# Patient Record
Sex: Female | Born: 1995 | Race: White | Hispanic: No | Marital: Single
Health system: Midwestern US, Academic
[De-identification: ages and names within clinical notes are randomized; demographics above are authoritative.]

---

## 2017-12-25 ENCOUNTER — Encounter: Admit: 2017-12-25 | Discharge: 2017-12-25

## 2017-12-28 ENCOUNTER — Encounter: Admit: 2017-12-28 | Discharge: 2017-12-28

## 2017-12-28 DIAGNOSIS — F4310 Post-traumatic stress disorder, unspecified: ICD-10-CM

## 2017-12-28 DIAGNOSIS — E119 Type 2 diabetes mellitus without complications: Principal | ICD-10-CM

## 2017-12-28 LAB — POC GLUCOSE
Lab: 53 mg/dL — ABNORMAL LOW (ref 70–100)
Lab: 88 mg/dL (ref 70–100)
Lab: 75 mg/dL (ref 70–100)
Lab: 53 mg/dL — ABNORMAL LOW (ref 70–100)
Lab: 53 mg/dL — ABNORMAL LOW (ref 70–100)
Lab: 70 mg/dL (ref 70–100)
Lab: 91 mg/dL (ref 70–100)

## 2017-12-28 LAB — URINALYSIS DIPSTICK
Lab: NEGATIVE
Lab: NEGATIVE MMOL/L (ref 21–30)
Lab: NEGATIVE U/L (ref 7–40)
Lab: NEGATIVE U/L (ref 7–56)
Lab: NEGATIVE mL/min
Lab: NEGATIVE
Lab: NEGATIVE (ref 3–12)

## 2017-12-28 LAB — THYROID STIMULATING HORMONE-TSH: Lab: 1.6 uU/mL (ref 0.35–5.00)

## 2017-12-28 LAB — HEPATITIS B SURFACE AG: Lab: NEGATIVE MMOL/L — ABNORMAL LOW (ref 98–110)

## 2017-12-28 LAB — COMPREHENSIVE METABOLIC PANEL: Lab: 139 MMOL/L (ref 137–147)

## 2017-12-28 LAB — HEPATITIS C ANTIBODY W REFLEX HCV PCR QUANT: Lab: NEGATIVE

## 2017-12-28 LAB — BETA-HCG: Lab: 410 U/L — ABNORMAL HIGH (ref <5)

## 2017-12-28 LAB — RUBELLA AB IGG

## 2017-12-28 LAB — FREE T4 (FREE THYROXINE) ONLY: Lab: 0.9 ng/dL (ref 0.6–1.6)

## 2017-12-28 LAB — CBC: Lab: 7 10*3/uL (ref 4.5–11.0)

## 2017-12-28 LAB — URINALYSIS, MICROSCOPIC

## 2017-12-28 LAB — HIV, STAT: Lab: NEGATIVE mg/dL (ref 70–100)

## 2017-12-28 LAB — HEMOGLOBIN A1C: Lab: 7 % — ABNORMAL HIGH (ref 4.0–6.0)

## 2017-12-28 MED ORDER — INSULIN REGULAR HUMAN(#) 1 UNIT/ML IJ SYRINGE
1-20 [IU] | INTRAVENOUS | 0 refills | Status: DC
Start: 2017-12-28 — End: 2018-01-01

## 2017-12-28 MED ORDER — INSULIN REGULAR HUMAN(#) 1 UNIT/ML IJ SYRINGE
2 [IU] | Freq: Once | INTRAVENOUS | 0 refills | Status: CP
Start: 2017-12-28 — End: ?
  Administered 2017-12-29: 04:00:00 2 [IU] via INTRAVENOUS

## 2017-12-28 MED ORDER — INSULIN PUMP - ASPART
Freq: Two times a day (BID) | SUBCUTANEOUS | 0 refills | Status: DC
Start: 2017-12-28 — End: 2018-01-01

## 2017-12-29 ENCOUNTER — Encounter: Admit: 2017-12-29 | Discharge: 2017-12-29

## 2017-12-29 LAB — URINE COLLECTION
Lab: 174 mL
Lab: 24

## 2017-12-29 LAB — POC GLUCOSE
Lab: 191 mg/dL — ABNORMAL HIGH (ref 70–100)
Lab: 209 mg/dL — ABNORMAL HIGH (ref 70–100)
Lab: 178 mg/dL — ABNORMAL HIGH (ref 70–100)
Lab: 164 mg/dL — ABNORMAL HIGH (ref 70–100)
Lab: 162 mg/dL — ABNORMAL HIGH (ref 70–100)
Lab: 140 mg/dL — ABNORMAL HIGH (ref 70–100)
Lab: 149 mg/dL — ABNORMAL HIGH (ref 70–100)
Lab: 141 mg/dL — ABNORMAL HIGH (ref 70–100)
Lab: 140 mg/dL — ABNORMAL HIGH (ref 70–100)
Lab: 125 mg/dL — ABNORMAL HIGH (ref 70–100)
Lab: 103 mg/dL — ABNORMAL HIGH (ref 70–100)
Lab: 71 mg/dL (ref 70–100)
Lab: 82 mg/dL — ABNORMAL HIGH (ref 70–100)
Lab: 112 mg/dL — ABNORMAL HIGH (ref 70–100)

## 2017-12-29 LAB — TOTAL PROTEIN-URINE 24 HR: Lab: <4 mg/dL

## 2017-12-29 LAB — CREATININE CLEARANCE-URINE 24H: Lab: 58 mg/dL

## 2017-12-29 LAB — CULTURE-URINE W/SENSITIVITY

## 2017-12-29 LAB — SYPHILIS AB SCREEN: Lab: NEGATIVE mg/dL (ref 0.4–1.00)

## 2017-12-30 LAB — POC GLUCOSE
Lab: 177 mg/dL — ABNORMAL HIGH (ref 70–100)
Lab: 180 mg/dL — ABNORMAL HIGH (ref 70–100)
Lab: 196 mg/dL — ABNORMAL HIGH (ref 70–100)
Lab: 133 mg/dL — ABNORMAL HIGH (ref 70–100)
Lab: 64 mg/dL — ABNORMAL LOW (ref 70–100)
Lab: 88 mg/dL (ref >60)
Lab: 71 mg/dL (ref 70–100)
Lab: 123 mg/dL — ABNORMAL HIGH (ref 70–100)
Lab: 83 mg/dL (ref 70–100)
Lab: 47 mg/dL — CL (ref 70–100)
Lab: 170 mg/dL — ABNORMAL HIGH (ref >60)
Lab: 123 mg/dL — ABNORMAL HIGH (ref 70–100)

## 2017-12-31 ENCOUNTER — Ambulatory Visit: Admit: 2017-12-28 | Discharge: 2017-12-28

## 2017-12-31 ENCOUNTER — Inpatient Hospital Stay
Admit: 2017-12-28 | Discharge: 2017-12-31 | Disposition: A | Discharge status: Home / Self Care | Payer: BC Managed Care – PPO | Attending: Maternal & Fetal Medicine | Admitting: Maternal & Fetal Medicine

## 2017-12-31 ENCOUNTER — Encounter: Admit: 2017-12-31 | Discharge: 2017-12-31

## 2017-12-31 DIAGNOSIS — F4310 Post-traumatic stress disorder, unspecified: ICD-10-CM

## 2017-12-31 DIAGNOSIS — Z62810 Personal history of physical and sexual abuse in childhood: ICD-10-CM

## 2017-12-31 DIAGNOSIS — Z9641 Presence of insulin pump (external) (internal): ICD-10-CM

## 2017-12-31 DIAGNOSIS — Z794 Long term (current) use of insulin: ICD-10-CM

## 2017-12-31 DIAGNOSIS — Z3A01 Less than 8 weeks gestation of pregnancy: Secondary | ICD-10-CM

## 2017-12-31 DIAGNOSIS — O24011 Pre-existing diabetes mellitus, type 1, in pregnancy, first trimester: Principal | ICD-10-CM

## 2017-12-31 DIAGNOSIS — O99341 Other mental disorders complicating pregnancy, first trimester: ICD-10-CM

## 2017-12-31 DIAGNOSIS — E1065 Type 1 diabetes mellitus with hyperglycemia: ICD-10-CM

## 2017-12-31 LAB — POC GLUCOSE
Lab: 65 mg/dL — ABNORMAL LOW (ref 70–100)
Lab: 73 mg/dL (ref 70–100)
Lab: 76 mg/dL (ref 70–100)
Lab: 70 mg/dL (ref 70–100)
Lab: 95 mg/dL (ref 70–100)
Lab: 99 mg/dL (ref 70–100)
Lab: 158 mg/dL — ABNORMAL HIGH (ref 70–100)
Lab: 87 mg/dL (ref 70–100)
Lab: 91 mg/dL (ref 70–100)

## 2017-12-31 LAB — DIRECT EXAM (WET PREP)

## 2018-01-01 LAB — CHLAM/NG PCR URINE: Lab: NEGATIVE FL (ref 80–100)

## 2018-01-05 ENCOUNTER — Encounter: Admit: 2018-01-05 | Discharge: 2018-01-05

## 2018-01-05 DIAGNOSIS — F4310 Post-traumatic stress disorder, unspecified: ICD-10-CM

## 2018-01-05 DIAGNOSIS — E119 Type 2 diabetes mellitus without complications: Principal | ICD-10-CM

## 2018-01-06 ENCOUNTER — Encounter: Admit: 2018-01-06 | Discharge: 2018-01-06

## 2018-01-06 ENCOUNTER — Ambulatory Visit: Admit: 2018-01-06 | Discharge: 2018-01-07 | Payer: BC Managed Care – PPO

## 2018-01-06 DIAGNOSIS — E119 Type 2 diabetes mellitus without complications: Principal | ICD-10-CM

## 2018-01-06 DIAGNOSIS — F4310 Post-traumatic stress disorder, unspecified: ICD-10-CM

## 2018-01-07 ENCOUNTER — Encounter: Admit: 2018-01-07 | Discharge: 2018-01-07

## 2018-01-07 DIAGNOSIS — Z3A01 Less than 8 weeks gestation of pregnancy: Principal | ICD-10-CM

## 2018-01-07 DIAGNOSIS — E119 Type 2 diabetes mellitus without complications: Principal | ICD-10-CM

## 2018-01-07 DIAGNOSIS — F4310 Post-traumatic stress disorder, unspecified: ICD-10-CM

## 2018-01-21 ENCOUNTER — Encounter: Admit: 2018-01-21 | Discharge: 2018-01-21

## 2018-01-21 ENCOUNTER — Ambulatory Visit: Admit: 2018-01-21 | Discharge: 2018-01-22 | Payer: BC Managed Care – PPO

## 2018-01-21 DIAGNOSIS — E119 Type 2 diabetes mellitus without complications: Principal | ICD-10-CM

## 2018-01-21 DIAGNOSIS — F4310 Post-traumatic stress disorder, unspecified: ICD-10-CM

## 2018-01-22 ENCOUNTER — Encounter: Admit: 2018-01-22 | Discharge: 2018-01-22

## 2018-01-22 DIAGNOSIS — Z3A01 Less than 8 weeks gestation of pregnancy: Principal | ICD-10-CM

## 2018-01-22 DIAGNOSIS — E119 Type 2 diabetes mellitus without complications: Principal | ICD-10-CM

## 2018-01-22 DIAGNOSIS — F4310 Post-traumatic stress disorder, unspecified: ICD-10-CM

## 2018-01-22 MED ORDER — ONDANSETRON 4 MG PO TBDI
4 mg | ORAL_TABLET | ORAL | 0 refills | Status: SS | PRN
Start: 2018-01-22 — End: 2018-04-07

## 2018-02-12 ENCOUNTER — Encounter: Admit: 2018-02-12 | Discharge: 2018-02-12

## 2018-02-12 DIAGNOSIS — O24911 Unspecified diabetes mellitus in pregnancy, first trimester: Principal | ICD-10-CM

## 2018-02-15 ENCOUNTER — Encounter: Admit: 2018-02-15 | Discharge: 2018-02-15

## 2018-02-15 ENCOUNTER — Ambulatory Visit: Admit: 2018-02-15 | Discharge: 2018-02-15

## 2018-02-15 DIAGNOSIS — F4310 Post-traumatic stress disorder, unspecified: ICD-10-CM

## 2018-02-15 DIAGNOSIS — E119 Type 2 diabetes mellitus without complications: Principal | ICD-10-CM

## 2018-02-15 DIAGNOSIS — O0991 Supervision of high risk pregnancy, unspecified, first trimester: Secondary | ICD-10-CM

## 2018-02-16 ENCOUNTER — Ambulatory Visit: Admit: 2018-02-15 | Discharge: 2018-02-16 | Payer: BC Managed Care – PPO

## 2018-02-16 DIAGNOSIS — O24911 Unspecified diabetes mellitus in pregnancy, first trimester: Principal | ICD-10-CM

## 2018-02-16 DIAGNOSIS — Z3682 Encounter for antenatal screening for nuchal translucency: ICD-10-CM

## 2018-02-16 DIAGNOSIS — O0991 Supervision of high risk pregnancy, unspecified, first trimester: ICD-10-CM

## 2018-03-01 ENCOUNTER — Ambulatory Visit: Admit: 2018-03-01 | Discharge: 2018-03-02 | Payer: BC Managed Care – PPO

## 2018-03-01 ENCOUNTER — Encounter: Admit: 2018-03-01 | Discharge: 2018-03-01

## 2018-03-01 DIAGNOSIS — F4310 Post-traumatic stress disorder, unspecified: ICD-10-CM

## 2018-03-01 DIAGNOSIS — E119 Type 2 diabetes mellitus without complications: Principal | ICD-10-CM

## 2018-03-02 DIAGNOSIS — O0992 Supervision of high risk pregnancy, unspecified, second trimester: Principal | ICD-10-CM

## 2018-03-02 DIAGNOSIS — Z3A14 14 weeks gestation of pregnancy: ICD-10-CM

## 2018-03-08 ENCOUNTER — Encounter: Admit: 2018-03-08 | Discharge: 2018-03-08

## 2018-03-08 DIAGNOSIS — E119 Type 2 diabetes mellitus without complications: Principal | ICD-10-CM

## 2018-03-08 DIAGNOSIS — F4310 Post-traumatic stress disorder, unspecified: ICD-10-CM

## 2018-03-08 DIAGNOSIS — Z3A01 Less than 8 weeks gestation of pregnancy: Secondary | ICD-10-CM

## 2018-03-08 DIAGNOSIS — O0990 Supervision of high risk pregnancy, unspecified, unspecified trimester: ICD-10-CM

## 2018-03-09 ENCOUNTER — Ambulatory Visit: Admit: 2018-03-08 | Discharge: 2018-03-09 | Payer: BC Managed Care – PPO

## 2018-03-09 DIAGNOSIS — O24912 Unspecified diabetes mellitus in pregnancy, second trimester: ICD-10-CM

## 2018-03-09 DIAGNOSIS — Z794 Long term (current) use of insulin: ICD-10-CM

## 2018-03-09 DIAGNOSIS — Z3A15 15 weeks gestation of pregnancy: ICD-10-CM

## 2018-03-09 DIAGNOSIS — O24012 Pre-existing diabetes mellitus, type 1, in pregnancy, second trimester: Principal | ICD-10-CM

## 2018-03-09 DIAGNOSIS — E109 Type 1 diabetes mellitus without complications: ICD-10-CM

## 2018-03-24 ENCOUNTER — Encounter: Admit: 2018-03-24 | Discharge: 2018-03-24

## 2018-03-26 ENCOUNTER — Encounter: Admit: 2018-03-26 | Discharge: 2018-03-26

## 2018-03-26 DIAGNOSIS — O24912 Unspecified diabetes mellitus in pregnancy, second trimester: Principal | ICD-10-CM

## 2018-03-26 DIAGNOSIS — Z363 Encounter for antenatal screening for malformations: ICD-10-CM

## 2018-03-26 DIAGNOSIS — Z3686 Encounter for antenatal screening for cervical length: ICD-10-CM

## 2018-03-29 ENCOUNTER — Ambulatory Visit: Admit: 2018-03-29 | Discharge: 2018-03-29

## 2018-03-29 ENCOUNTER — Encounter: Admit: 2018-03-29 | Discharge: 2018-03-29

## 2018-03-29 DIAGNOSIS — F4310 Post-traumatic stress disorder, unspecified: ICD-10-CM

## 2018-03-29 DIAGNOSIS — E109 Type 1 diabetes mellitus without complications: ICD-10-CM

## 2018-03-29 DIAGNOSIS — E119 Type 2 diabetes mellitus without complications: Principal | ICD-10-CM

## 2018-03-30 ENCOUNTER — Encounter: Admit: 2018-03-30 | Discharge: 2018-03-30

## 2018-03-30 ENCOUNTER — Ambulatory Visit: Admit: 2018-03-29 | Discharge: 2018-03-30 | Payer: Commercial Managed Care - HMO

## 2018-03-30 DIAGNOSIS — O0992 Supervision of high risk pregnancy, unspecified, second trimester: Principal | ICD-10-CM

## 2018-03-31 ENCOUNTER — Encounter: Admit: 2018-03-31 | Discharge: 2018-03-31

## 2018-04-06 ENCOUNTER — Encounter: Admit: 2018-04-06 | Discharge: 2018-04-06

## 2018-04-06 ENCOUNTER — Observation Stay: Admit: 2018-04-07 | Discharge: 2018-04-08 | Payer: Commercial Managed Care - HMO

## 2018-04-06 DIAGNOSIS — O24919 Unspecified diabetes mellitus in pregnancy, unspecified trimester: ICD-10-CM

## 2018-04-06 DIAGNOSIS — K929 Disease of digestive system, unspecified: ICD-10-CM

## 2018-04-06 DIAGNOSIS — E119 Type 2 diabetes mellitus without complications: Principal | ICD-10-CM

## 2018-04-06 DIAGNOSIS — F4310 Post-traumatic stress disorder, unspecified: ICD-10-CM

## 2018-04-06 DIAGNOSIS — K589 Irritable bowel syndrome without diarrhea: ICD-10-CM

## 2018-04-06 MED ORDER — PRENATAL VIT-IRON FUM-FOLIC AC 28 MG IRON- 800 MCG PO TAB
1 | Freq: Every day | ORAL | 0 refills | Status: DC
Start: 2018-04-06 — End: 2018-04-08
  Administered 2018-04-07 – 2018-04-08 (×2): 1 via ORAL

## 2018-04-06 MED ORDER — ONDANSETRON 4 MG PO TBDI
4 mg | ORAL | 0 refills | Status: DC | PRN
Start: 2018-04-06 — End: 2018-04-08

## 2018-04-06 MED ORDER — INSULIN PUMP - LISPRO
Freq: Three times a day (TID) | SUBCUTANEOUS | 0 refills | Status: DC | PRN
Start: 2018-04-06 — End: 2018-04-07

## 2018-04-07 LAB — HEMOGLOBIN A1C: Lab: 5.9 % (ref 4.0–6.0)

## 2018-04-07 LAB — POC GLUCOSE
Lab: 161 mg/dL — ABNORMAL HIGH (ref 70–100)
Lab: 113 mg/dL — ABNORMAL HIGH (ref 70–100)
Lab: 59 mg/dL — ABNORMAL LOW (ref 70–100)
Lab: 61 mg/dL — ABNORMAL LOW (ref 70–100)
Lab: 73 mg/dL (ref 70–100)
Lab: 50 mg/dL — ABNORMAL LOW (ref 70–100)
Lab: 69 mg/dL — ABNORMAL LOW (ref 70–100)
Lab: 80 mg/dL (ref 70–100)
Lab: 80 mg/dL (ref 70–100)
Lab: 64 mg/dL — ABNORMAL LOW (ref 70–100)
Lab: 160 mg/dL — ABNORMAL HIGH (ref 70–100)
Lab: 146 mg/dL — ABNORMAL HIGH (ref 70–100)
Lab: 112 mg/dL — ABNORMAL HIGH (ref 70–100)
Lab: 117 mg/dL — ABNORMAL HIGH (ref 70–100)

## 2018-04-07 MED ORDER — INSULIN PUMP - LISPRO
Freq: Three times a day (TID) | SUBCUTANEOUS | 0 refills | Status: DC | PRN
Start: 2018-04-07 — End: 2018-04-08

## 2018-04-08 DIAGNOSIS — Z9641 Presence of insulin pump (external) (internal): ICD-10-CM

## 2018-04-08 DIAGNOSIS — Z0489 Encounter for examination and observation for other specified reasons: ICD-10-CM

## 2018-04-08 DIAGNOSIS — Z794 Long term (current) use of insulin: ICD-10-CM

## 2018-04-08 DIAGNOSIS — F4310 Post-traumatic stress disorder, unspecified: ICD-10-CM

## 2018-04-08 DIAGNOSIS — O24012 Pre-existing diabetes mellitus, type 1, in pregnancy, second trimester: Principal | ICD-10-CM

## 2018-04-08 DIAGNOSIS — Z887 Allergy status to serum and vaccine status: ICD-10-CM

## 2018-04-08 DIAGNOSIS — O99342 Other mental disorders complicating pregnancy, second trimester: ICD-10-CM

## 2018-04-08 DIAGNOSIS — E109 Type 1 diabetes mellitus without complications: ICD-10-CM

## 2018-04-08 DIAGNOSIS — Z881 Allergy status to other antibiotic agents status: ICD-10-CM

## 2018-04-08 DIAGNOSIS — Z880 Allergy status to penicillin: ICD-10-CM

## 2018-04-08 DIAGNOSIS — Z62810 Personal history of physical and sexual abuse in childhood: ICD-10-CM

## 2018-04-08 DIAGNOSIS — Z3A20 20 weeks gestation of pregnancy: ICD-10-CM

## 2018-04-08 LAB — POC GLUCOSE
Lab: 163 mg/dL — ABNORMAL HIGH (ref 70–100)
Lab: 145 mg/dL — ABNORMAL HIGH (ref 70–100)
Lab: 154 mg/dL — ABNORMAL HIGH (ref 70–100)
Lab: 118 mg/dL — ABNORMAL HIGH (ref 70–100)
Lab: 71 mg/dL (ref 70–100)
Lab: 114 mg/dL — ABNORMAL HIGH (ref 70–100)
Lab: 86 mg/dL (ref 70–100)
Lab: 70 mg/dL (ref 70–100)

## 2018-04-08 MED ORDER — ONDANSETRON 4 MG PO TBDI
4 mg | ORAL_TABLET | ORAL | 1 refills | 8.00000 days | Status: AC | PRN
Start: 2018-04-08 — End: ?

## 2018-04-12 ENCOUNTER — Ambulatory Visit: Admit: 2018-04-12 | Discharge: 2018-04-13 | Payer: Commercial Managed Care - HMO

## 2018-04-12 ENCOUNTER — Encounter: Admit: 2018-04-12 | Discharge: 2018-04-12

## 2018-04-12 DIAGNOSIS — F4310 Post-traumatic stress disorder, unspecified: ICD-10-CM

## 2018-04-12 DIAGNOSIS — K929 Disease of digestive system, unspecified: ICD-10-CM

## 2018-04-12 DIAGNOSIS — E119 Type 2 diabetes mellitus without complications: Principal | ICD-10-CM

## 2018-04-12 DIAGNOSIS — E109 Type 1 diabetes mellitus without complications: Secondary | ICD-10-CM

## 2018-04-12 DIAGNOSIS — K589 Irritable bowel syndrome without diarrhea: ICD-10-CM

## 2018-04-13 DIAGNOSIS — O24012 Pre-existing diabetes mellitus, type 1, in pregnancy, second trimester: Principal | ICD-10-CM

## 2018-04-13 DIAGNOSIS — Z3A20 20 weeks gestation of pregnancy: ICD-10-CM

## 2018-04-13 DIAGNOSIS — Z794 Long term (current) use of insulin: ICD-10-CM

## 2018-04-16 ENCOUNTER — Encounter: Admit: 2018-04-16 | Discharge: 2018-04-16

## 2018-04-19 ENCOUNTER — Encounter: Admit: 2018-04-19 | Discharge: 2018-04-19

## 2018-04-20 ENCOUNTER — Encounter: Admit: 2018-04-20 | Discharge: 2018-04-20

## 2018-04-20 DIAGNOSIS — O358XX0 Maternal care for other (suspected) fetal abnormality and damage, not applicable or unspecified: Principal | ICD-10-CM

## 2018-04-26 ENCOUNTER — Encounter: Admit: 2018-04-26 | Discharge: 2018-04-26

## 2018-04-26 ENCOUNTER — Ambulatory Visit: Admit: 2018-04-26 | Discharge: 2018-04-27 | Payer: Commercial Managed Care - HMO

## 2018-04-26 ENCOUNTER — Ambulatory Visit: Admit: 2018-04-26 | Discharge: 2018-04-26

## 2018-04-26 DIAGNOSIS — F4310 Post-traumatic stress disorder, unspecified: ICD-10-CM

## 2018-04-26 DIAGNOSIS — E119 Type 2 diabetes mellitus without complications: Principal | ICD-10-CM

## 2018-04-26 DIAGNOSIS — K589 Irritable bowel syndrome without diarrhea: ICD-10-CM

## 2018-04-26 DIAGNOSIS — K929 Disease of digestive system, unspecified: ICD-10-CM

## 2018-04-27 DIAGNOSIS — O24012 Pre-existing diabetes mellitus, type 1, in pregnancy, second trimester: ICD-10-CM

## 2018-04-27 DIAGNOSIS — O0992 Supervision of high risk pregnancy, unspecified, second trimester: Principal | ICD-10-CM

## 2018-05-03 ENCOUNTER — Encounter: Admit: 2018-05-03 | Discharge: 2018-05-03

## 2018-05-11 ENCOUNTER — Encounter: Admit: 2018-05-11 | Discharge: 2018-05-11

## 2018-05-13 ENCOUNTER — Encounter: Admit: 2018-05-13 | Discharge: 2018-05-13

## 2018-05-13 ENCOUNTER — Ambulatory Visit: Admit: 2018-05-13 | Discharge: 2018-05-13

## 2018-05-13 DIAGNOSIS — E109 Type 1 diabetes mellitus without complications: Principal | ICD-10-CM

## 2018-05-13 DIAGNOSIS — K929 Disease of digestive system, unspecified: ICD-10-CM

## 2018-05-13 DIAGNOSIS — Z3A22 22 weeks gestation of pregnancy: Secondary | ICD-10-CM

## 2018-05-13 DIAGNOSIS — K589 Irritable bowel syndrome without diarrhea: ICD-10-CM

## 2018-05-13 DIAGNOSIS — E119 Type 2 diabetes mellitus without complications: Principal | ICD-10-CM

## 2018-05-13 DIAGNOSIS — F4310 Post-traumatic stress disorder, unspecified: ICD-10-CM

## 2018-05-14 ENCOUNTER — Ambulatory Visit: Admit: 2018-05-13 | Discharge: 2018-05-14 | Payer: BC Managed Care – PPO

## 2018-05-14 DIAGNOSIS — E109 Type 1 diabetes mellitus without complications: Principal | ICD-10-CM

## 2018-05-14 DIAGNOSIS — Z3A22 22 weeks gestation of pregnancy: ICD-10-CM

## 2018-05-27 ENCOUNTER — Encounter: Admit: 2018-05-27 | Discharge: 2018-05-27

## 2018-05-27 DIAGNOSIS — E119 Type 2 diabetes mellitus without complications: Secondary | ICD-10-CM

## 2018-05-27 DIAGNOSIS — K929 Disease of digestive system, unspecified: Secondary | ICD-10-CM

## 2018-05-27 DIAGNOSIS — K589 Irritable bowel syndrome without diarrhea: Secondary | ICD-10-CM

## 2018-05-27 DIAGNOSIS — F4310 Post-traumatic stress disorder, unspecified: Secondary | ICD-10-CM

## 2018-05-27 LAB — CBC
Lab: 10 10*3/uL (ref 4.5–11.0)
Lab: 126 10*3/uL — ABNORMAL LOW (ref 150–400)
Lab: 14 % (ref 11–15)
Lab: 30 pg (ref 26–34)
Lab: 32 g/dL (ref 32.0–36.0)
Lab: 38 % (ref 36–45)
Lab: 4 M/UL (ref 4.0–5.0)
Lab: 8.9 FL (ref 7–11)
Lab: 95 FL (ref 80–100)

## 2018-05-28 ENCOUNTER — Ambulatory Visit: Admit: 2018-05-27 | Discharge: 2018-05-28 | Payer: BC Managed Care – PPO

## 2018-05-28 DIAGNOSIS — Z3A25 25 weeks gestation of pregnancy: Secondary | ICD-10-CM

## 2018-05-28 DIAGNOSIS — Z23 Encounter for immunization: Secondary | ICD-10-CM

## 2018-05-28 DIAGNOSIS — Z3A27 27 weeks gestation of pregnancy: Secondary | ICD-10-CM

## 2018-05-28 DIAGNOSIS — O0993 Supervision of high risk pregnancy, unspecified, third trimester: Secondary | ICD-10-CM

## 2018-05-28 LAB — SYPHILIS AB SCREEN: Lab: NEGATIVE g/dL (ref 12.0–15.0)

## 2018-06-03 ENCOUNTER — Encounter: Admit: 2018-06-03 | Discharge: 2018-06-03

## 2018-06-09 ENCOUNTER — Encounter: Admit: 2018-06-09 | Discharge: 2018-06-09

## 2018-06-09 DIAGNOSIS — E109 Type 1 diabetes mellitus without complications: Secondary | ICD-10-CM

## 2018-06-10 ENCOUNTER — Ambulatory Visit: Admit: 2018-06-10 | Discharge: 2018-06-10

## 2018-06-10 ENCOUNTER — Ambulatory Visit: Admit: 2018-06-10 | Discharge: 2018-06-11 | Payer: Medicaid Other

## 2018-06-10 ENCOUNTER — Encounter: Admit: 2018-06-10 | Discharge: 2018-06-10

## 2018-06-10 DIAGNOSIS — K589 Irritable bowel syndrome without diarrhea: Secondary | ICD-10-CM

## 2018-06-10 DIAGNOSIS — F4310 Post-traumatic stress disorder, unspecified: Secondary | ICD-10-CM

## 2018-06-10 DIAGNOSIS — E119 Type 2 diabetes mellitus without complications: Secondary | ICD-10-CM

## 2018-06-10 DIAGNOSIS — K929 Disease of digestive system, unspecified: Secondary | ICD-10-CM

## 2018-06-11 DIAGNOSIS — E109 Type 1 diabetes mellitus without complications: Secondary | ICD-10-CM

## 2018-06-11 DIAGNOSIS — O0993 Supervision of high risk pregnancy, unspecified, third trimester: Secondary | ICD-10-CM

## 2018-06-17 ENCOUNTER — Encounter: Admit: 2018-06-17 | Discharge: 2018-06-17

## 2018-06-21 ENCOUNTER — Encounter: Admit: 2018-06-21 | Discharge: 2018-06-21

## 2018-06-25 ENCOUNTER — Encounter: Admit: 2018-06-25 | Discharge: 2018-06-25

## 2018-06-28 ENCOUNTER — Encounter: Admit: 2018-06-28 | Discharge: 2018-06-29

## 2018-07-01 ENCOUNTER — Encounter: Admit: 2018-07-01 | Discharge: 2018-07-01

## 2018-07-05 ENCOUNTER — Encounter: Admit: 2018-07-05 | Discharge: 2018-07-06

## 2018-07-06 ENCOUNTER — Encounter: Admit: 2018-07-06 | Discharge: 2018-07-06

## 2018-07-06 DIAGNOSIS — O24912 Unspecified diabetes mellitus in pregnancy, second trimester: Principal | ICD-10-CM

## 2018-07-08 ENCOUNTER — Ambulatory Visit: Admit: 2018-07-08 | Discharge: 2018-07-09 | Payer: BC Managed Care – PPO

## 2018-07-08 ENCOUNTER — Encounter: Admit: 2018-07-08 | Discharge: 2018-07-08

## 2018-07-08 DIAGNOSIS — E119 Type 2 diabetes mellitus without complications: Principal | ICD-10-CM

## 2018-07-08 DIAGNOSIS — K589 Irritable bowel syndrome without diarrhea: ICD-10-CM

## 2018-07-08 DIAGNOSIS — F4310 Post-traumatic stress disorder, unspecified: ICD-10-CM

## 2018-07-08 DIAGNOSIS — K929 Disease of digestive system, unspecified: ICD-10-CM

## 2018-07-08 LAB — COMPREHENSIVE METABOLIC PANEL
Lab: 0.4 mg/dL (ref 0.3–1.2)
Lab: 0.6 mg/dL (ref 0.4–1.00)
Lab: 109 MMOL/L (ref 98–110)
Lab: 11 (ref 3–12)
Lab: 139 MMOL/L (ref 137–147)
Lab: 150 U/L — ABNORMAL HIGH (ref 25–110)
Lab: 19 MMOL/L — ABNORMAL LOW (ref 21–30)
Lab: 26 U/L (ref 7–56)
Lab: 3.7 g/dL (ref 3.5–5.0)
Lab: 30 U/L (ref 7–40)
Lab: 4 MMOL/L (ref 3.5–5.1)
Lab: 6 mg/dL — ABNORMAL LOW (ref 7–25)
Lab: 7.1 g/dL (ref 6.0–8.0)
Lab: 78 mg/dL (ref 70–100)
Lab: 9.5 mg/dL (ref 8.5–10.6)

## 2018-07-09 ENCOUNTER — Encounter: Admit: 2018-07-09 | Discharge: 2018-07-09

## 2018-07-09 ENCOUNTER — Ambulatory Visit: Admit: 2018-07-08 | Discharge: 2018-07-08

## 2018-07-09 DIAGNOSIS — L298 Other pruritus: ICD-10-CM

## 2018-07-09 DIAGNOSIS — E119 Type 2 diabetes mellitus without complications: Principal | ICD-10-CM

## 2018-07-09 DIAGNOSIS — F4310 Post-traumatic stress disorder, unspecified: ICD-10-CM

## 2018-07-09 DIAGNOSIS — E109 Type 1 diabetes mellitus without complications: Secondary | ICD-10-CM

## 2018-07-09 DIAGNOSIS — Z3A33 33 weeks gestation of pregnancy: ICD-10-CM

## 2018-07-09 DIAGNOSIS — K589 Irritable bowel syndrome without diarrhea: ICD-10-CM

## 2018-07-09 DIAGNOSIS — O9989 Other specified diseases and conditions complicating pregnancy, childbirth and the puerperium: ICD-10-CM

## 2018-07-09 DIAGNOSIS — O24912 Unspecified diabetes mellitus in pregnancy, second trimester: ICD-10-CM

## 2018-07-09 DIAGNOSIS — O24013 Pre-existing diabetes mellitus, type 1, in pregnancy, third trimester: Principal | ICD-10-CM

## 2018-07-09 DIAGNOSIS — Z794 Long term (current) use of insulin: ICD-10-CM

## 2018-07-09 DIAGNOSIS — K929 Disease of digestive system, unspecified: ICD-10-CM

## 2018-07-12 ENCOUNTER — Encounter: Admit: 2018-07-12 | Discharge: 2018-07-13

## 2018-07-12 ENCOUNTER — Encounter: Admit: 2018-07-12 | Discharge: 2018-07-12

## 2018-07-13 LAB — BILE ACIDS FRACTIONATED & TOTAL
Lab: 0.6
Lab: 0.7

## 2018-07-15 ENCOUNTER — Encounter: Admit: 2018-07-15 | Discharge: 2018-07-15

## 2018-07-15 NOTE — Telephone Encounter
Patient emailed blood glucose log.  Reviewed by Dr. Wyline Mood.    Patient reporting lows that are hard to bring up. Dr. Wyline Mood decreasing basal rate.     Current regimen  ???  Basal rates:  ???????????????????????????????????????0000-0400: 1.1   ???????????????????????????????????????0400-0800: 1.3   ???????????????????????????????????????0800-2200: 1.6  ???????????????????????????????????????2200-0000: 1.05   ???  ??? Bolus Rates:???  ???????????????????????????????????????Breakfast:???1:3   ???????????????????????????????????????Lunch: 1:6   ???????????????????????????????????????Dinner: 1:6   ???  ??? Premeal measurements - add to calculated bolus   ???????????????????????????????????????<100 - no extra   ???????????????????????????????????????101-120 - +0.5 units   ???????????????????????????????????????121-130 - +1.0 units   ???????????????????????????????????????131-140 - +1.5 units   ???????????????????????????????????????141-150 - +2.0 units???  Recommended regimen  ???  Basal rates:  ???????????????????????????????????????0000-0400: 1.1   ???????????????????????????????????????0400-0800: 1.3   ???????????????????????????????????????0800-2200: 1.5  ???????????????????????????????????????2200-0000: 1.05   ???  ??? Bolus Rates:???  ???????????????????????????????????????Breakfast:???1:3   ???????????????????????????????????????Lunch: 1:6   ???????????????????????????????????????Dinner: 1:6   ???  ??? Premeal measurements - add to calculated bolus   ???????????????????????????????????????<100 - no extra   ???????????????????????????????????????101-120 - +0.5 units   ???????????????????????????????????????121-130 - +1.0 units   ???????????????????????????????????????131-140 - +1.5 units   ???????????????????????????????????????141-150 - +2.0 units???    Patient emailed and informed of changes.

## 2018-07-22 ENCOUNTER — Encounter: Admit: 2018-07-22 | Discharge: 2018-07-22

## 2018-07-22 ENCOUNTER — Ambulatory Visit: Admit: 2018-07-22 | Discharge: 2018-07-23 | Payer: BC Managed Care – PPO

## 2018-07-22 DIAGNOSIS — K929 Disease of digestive system, unspecified: ICD-10-CM

## 2018-07-22 DIAGNOSIS — K589 Irritable bowel syndrome without diarrhea: ICD-10-CM

## 2018-07-22 DIAGNOSIS — E119 Type 2 diabetes mellitus without complications: Principal | ICD-10-CM

## 2018-07-22 DIAGNOSIS — F4310 Post-traumatic stress disorder, unspecified: ICD-10-CM

## 2018-07-22 DIAGNOSIS — Z3A33 33 weeks gestation of pregnancy: ICD-10-CM

## 2018-07-22 NOTE — Progress Notes
Patient seen in clinic. States she needs a refill for insulin. Contact [facility] in [place]. Called in new prescription.

## 2018-07-23 DIAGNOSIS — O0993 Supervision of high risk pregnancy, unspecified, third trimester: Principal | ICD-10-CM

## 2018-07-30 ENCOUNTER — Encounter: Admit: 2018-07-30 | Discharge: 2018-07-30

## 2018-08-03 ENCOUNTER — Encounter: Admit: 2018-08-03 | Discharge: 2018-08-03

## 2018-08-04 ENCOUNTER — Encounter: Admit: 2018-08-04 | Discharge: 2018-08-04

## 2018-08-04 DIAGNOSIS — O24913 Unspecified diabetes mellitus in pregnancy, third trimester: ICD-10-CM

## 2018-08-04 DIAGNOSIS — E109 Type 1 diabetes mellitus without complications: Principal | ICD-10-CM

## 2018-08-05 ENCOUNTER — Ambulatory Visit: Admit: 2018-08-05 | Discharge: 2018-08-05

## 2018-08-05 ENCOUNTER — Encounter: Admit: 2018-08-05 | Discharge: 2018-08-05

## 2018-08-05 ENCOUNTER — Ambulatory Visit: Admit: 2018-08-05 | Discharge: 2018-08-05 | Payer: BC Managed Care – PPO

## 2018-08-05 DIAGNOSIS — K589 Irritable bowel syndrome without diarrhea: ICD-10-CM

## 2018-08-05 DIAGNOSIS — E119 Type 2 diabetes mellitus without complications: Principal | ICD-10-CM

## 2018-08-05 DIAGNOSIS — O24913 Unspecified diabetes mellitus in pregnancy, third trimester: Principal | ICD-10-CM

## 2018-08-05 DIAGNOSIS — F4310 Post-traumatic stress disorder, unspecified: ICD-10-CM

## 2018-08-05 DIAGNOSIS — K929 Disease of digestive system, unspecified: ICD-10-CM

## 2018-08-09 ENCOUNTER — Encounter: Admit: 2018-08-09 | Discharge: 2018-08-09

## 2018-08-09 ENCOUNTER — Inpatient Hospital Stay: Admit: 2018-08-09 | Discharge: 2018-08-09

## 2018-08-09 DIAGNOSIS — O2402 Pre-existing diabetes mellitus, type 1, in childbirth: Principal | ICD-10-CM

## 2018-08-09 DIAGNOSIS — F4310 Post-traumatic stress disorder, unspecified: ICD-10-CM

## 2018-08-09 DIAGNOSIS — O24919 Unspecified diabetes mellitus in pregnancy, unspecified trimester: ICD-10-CM

## 2018-08-09 DIAGNOSIS — E119 Type 2 diabetes mellitus without complications: Principal | ICD-10-CM

## 2018-08-09 DIAGNOSIS — K929 Disease of digestive system, unspecified: ICD-10-CM

## 2018-08-09 DIAGNOSIS — K589 Irritable bowel syndrome without diarrhea: ICD-10-CM

## 2018-08-09 MED ORDER — CEFAZOLIN 1 GRAM IJ SOLR
0 refills | Status: DC
Start: 2018-08-09 — End: 2018-08-10
  Administered 2018-08-10: 05:00:00 2 g via INTRAVENOUS

## 2018-08-09 MED ORDER — LIDOCAINE-EPINEPHRINE (PF) 1.5 %-1:200,000 IJ SOLN (OR)
0 refills | Status: CP
Start: 2018-08-09 — End: ?

## 2018-08-09 MED ORDER — DEXTROSE 5%-0.9% SODIUM CHLORIDE IV SOLP
INTRAVENOUS | 0 refills | Status: CN
Start: 2018-08-09 — End: ?

## 2018-08-09 MED ORDER — AZITHROMYCIN 500 MG IVPB (AN)(OSM)
0 refills | Status: DC
Start: 2018-08-09 — End: 2018-08-10
  Administered 2018-08-10 (×2): 500 mg via INTRAVENOUS

## 2018-08-09 MED ORDER — LACTATED RINGERS IV SOLP
INTRAVENOUS | 0 refills | Status: DC
Start: 2018-08-09 — End: 2018-08-10
  Administered 2018-08-10: 03:00:00 500 mL via INTRAVENOUS
  Administered 2018-08-10: 07:00:00 1000.000 mL via INTRAVENOUS

## 2018-08-09 MED ORDER — CHLOROPROCAINE (PF) 30 MG/ML (3 %) IJ SOLN
0 refills | Status: DC
Start: 2018-08-09 — End: 2018-08-10
  Administered 2018-08-10: 05:00:00 2 mL via EPIDURAL
  Administered 2018-08-10: 05:00:00 8 mL via EPIDURAL

## 2018-08-09 MED ORDER — LACTATED RINGERS IV SOLP
INTRAVENOUS | 0 refills | Status: CN
Start: 2018-08-09 — End: ?

## 2018-08-09 MED ORDER — MISOPROSTOL  25 MCG PO TAB
25 ug | VAGINAL | 0 refills | Status: DC
Start: 2018-08-09 — End: 2018-08-10

## 2018-08-09 MED ORDER — MISOPROSTOL 200 MCG PO TAB
800 ug | Freq: Once | RECTAL | 0 refills | Status: CN
Start: 2018-08-09 — End: ?

## 2018-08-09 MED ORDER — FENTANYL CITRATE (PF) 50 MCG/ML IJ SOLN
INTRATHECAL | 0 refills | Status: CP
Start: 2018-08-09 — End: ?
  Administered 2018-08-10: 05:00:00 10 ug via INTRATHECAL

## 2018-08-09 MED ORDER — MISOPROSTOL 200 MCG PO TAB
800 ug | Freq: Once | RECTAL | 0 refills | Status: DC
Start: 2018-08-09 — End: 2018-08-10
  Administered 2018-08-10: 07:00:00 800 ug via RECTAL

## 2018-08-09 MED ORDER — OXYTOCIN IN 0.9 % SOD CHLORIDE 30 UNIT/500 ML IV SOLN
24 [IU] | Freq: Once | INTRAVENOUS | 0 refills | Status: DC
Start: 2018-08-09 — End: 2018-08-10
  Administered 2018-08-10: 07:00:00 24 [IU] via INTRAVENOUS

## 2018-08-09 MED ORDER — OXYTOCIN IN 0.9 % SOD CHLORIDE 30 UNIT/500 ML IV SOLN
.5-20 m[IU]/min | INTRAVENOUS | 0 refills | Status: CN
Start: 2018-08-09 — End: ?

## 2018-08-09 MED ORDER — AZITHROMYCIN 500 MG IVPB (AN)(OSM)
INTRAVENOUS | 0 refills | Status: DC
Start: 2018-08-09 — End: 2018-08-10

## 2018-08-09 MED ORDER — ONDANSETRON HCL (PF) 4 MG/2 ML IJ SOLN
0 refills | Status: DC
Start: 2018-08-09 — End: 2018-08-10
  Administered 2018-08-10 (×2): 4 mg via INTRAVENOUS

## 2018-08-09 MED ORDER — OXYTOCIN IN 0.9 % SOD CHLORIDE 30 UNIT/500 ML IV SOLN
.5-20 m[IU]/min | INTRAVENOUS | 0 refills | Status: DC
Start: 2018-08-09 — End: 2018-08-10

## 2018-08-09 MED ORDER — DEXTROSE 5%-0.9% SODIUM CHLORIDE IV SOLP
INTRAVENOUS | 0 refills | Status: DC
Start: 2018-08-09 — End: 2018-08-10
  Administered 2018-08-10: 04:00:00 1000.000 mL via INTRAVENOUS

## 2018-08-09 MED ORDER — MISOPROSTOL  25 MCG PO TAB
25 ug | VAGINAL | 0 refills | Status: CN
Start: 2018-08-09 — End: ?

## 2018-08-09 MED ORDER — BUPIVACAINE (PF) 0.75 % (7.5 MG/ML) IJ SOLN
INTRATHECAL | 0 refills | Status: CP
Start: 2018-08-09 — End: ?
  Administered 2018-08-10: 05:00:00 1.5 mL via INTRATHECAL

## 2018-08-09 MED ORDER — OXYTOCIN IN 0.9 % SOD CHLORIDE 30 UNIT/500 ML IV SOLN
24 [IU] | Freq: Once | INTRAVENOUS | 0 refills | Status: CN
Start: 2018-08-09 — End: ?

## 2018-08-09 MED ORDER — SODIUM CITRATE-CITRIC ACID 500-334 MG/5 ML PO SOLN
30 mL | Freq: Once | ORAL | 0 refills | Status: CP | PRN
Start: 2018-08-09 — End: ?
  Administered 2018-08-10: 04:00:00 30 mL via ORAL

## 2018-08-09 MED ORDER — MORPHINE (PF) 1 MG/ML IJ SOLN
INTRATHECAL | 0 refills | Status: CP
Start: 2018-08-09 — End: ?
  Administered 2018-08-10: 05:00:00 100 ug via INTRATHECAL

## 2018-08-09 MED ORDER — SODIUM CITRATE-CITRIC ACID 500-334 MG/5 ML PO SOLN
30 mL | Freq: Once | ORAL | 0 refills | Status: CN | PRN
Start: 2018-08-09 — End: ?

## 2018-08-09 MED ORDER — INSULIN REGULAR IN 0.9 % NACL 100 UNIT/100 ML (1 UNIT/ML) IV SOLN
1-32 [IU]/h | INTRAVENOUS | 0 refills | Status: DC
Start: 2018-08-09 — End: 2018-08-10

## 2018-08-09 MED ORDER — FAMOTIDINE (PF) 20 MG/2 ML IV SOLN
INTRAVENOUS | 0 refills | Status: DC
Start: 2018-08-09 — End: 2018-08-10
  Administered 2018-08-10: 05:00:00 20 mg via INTRAVENOUS

## 2018-08-09 NOTE — Telephone Encounter
Received a call from Dr. Alona Bene, patient's primary OB. He requested patient be induced at [facility] as he does not have the staff to run an insulin drip at his facility tonight. Discussed with Dr. Nedra Hai. Induction scheduled for 8pm. Patient notified.

## 2018-08-10 ENCOUNTER — Encounter: Admit: 2018-08-10 | Discharge: 2018-08-10

## 2018-08-10 ENCOUNTER — Inpatient Hospital Stay
Admit: 2018-08-10 | Discharge: 2018-08-13 | Disposition: A | Discharge status: Home / Self Care | Payer: BC Managed Care – PPO

## 2018-08-10 DIAGNOSIS — K929 Disease of digestive system, unspecified: ICD-10-CM

## 2018-08-10 DIAGNOSIS — F4310 Post-traumatic stress disorder, unspecified: ICD-10-CM

## 2018-08-10 DIAGNOSIS — E119 Type 2 diabetes mellitus without complications: Principal | ICD-10-CM

## 2018-08-10 DIAGNOSIS — K589 Irritable bowel syndrome without diarrhea: ICD-10-CM

## 2018-08-10 LAB — POC GLUCOSE
Lab: 102 mg/dL — ABNORMAL HIGH (ref 70–100)
Lab: 104 mg/dL — ABNORMAL HIGH (ref 70–100)
Lab: 110 mg/dL — ABNORMAL HIGH (ref 70–100)
Lab: 124 mg/dL — ABNORMAL HIGH (ref 70–100)
Lab: 127 mg/dL — ABNORMAL HIGH (ref 70–100)
Lab: 143 mg/dL — ABNORMAL HIGH (ref 70–100)
Lab: 148 mg/dL — ABNORMAL HIGH (ref 70–100)
Lab: 154 mg/dL — ABNORMAL HIGH (ref 70–100)
Lab: 166 mg/dL — ABNORMAL HIGH (ref 70–100)
Lab: 200 mg/dL — ABNORMAL HIGH (ref 70–100)
Lab: 42 mg/dL — CL (ref 70–100)
Lab: 89 mg/dL (ref 70–100)
Lab: 92 mg/dL (ref 70–100)

## 2018-08-10 LAB — CBC
Lab: 11 g/dL — ABNORMAL LOW (ref 12.0–15.0)
Lab: 14 % (ref 11–15)
Lab: 230 10*3/uL (ref 150–400)
Lab: 30 pg (ref 26–34)
Lab: 34 g/dL (ref 32.0–36.0)
Lab: 87 FL (ref 80–100)
Lab: 9.6 FL (ref 7–11)
Lab: 10 FL (ref 7–11)
Lab: 11 g/dL — ABNORMAL LOW (ref 12.0–15.0)
Lab: 14 % (ref 11–15)
Lab: 17 10*3/uL — ABNORMAL HIGH (ref 4.5–11.0)
Lab: 244 10*3/uL (ref 150–400)
Lab: 29 pg (ref 26–34)
Lab: 33 g/dL (ref 32.0–36.0)
Lab: 89 FL (ref 80–100)

## 2018-08-10 LAB — BLOOD GASES-CORD BLOOD: Lab: 7.2

## 2018-08-10 MED ORDER — FENTANYL CITRATE (PF) 50 MCG/ML IJ SOLN
50 ug | INTRAVENOUS | 0 refills | Status: DC | PRN
Start: 2018-08-10 — End: 2018-08-13
  Administered 2018-08-10: 08:00:00 50 ug via INTRAVENOUS

## 2018-08-10 MED ORDER — MORPHINE 2 MG/ML IV CRTG
1 mg | Freq: Once | INTRAVENOUS | 0 refills | Status: CP
Start: 2018-08-10 — End: ?
  Administered 2018-08-10: 07:00:00 1 mg via INTRAVENOUS

## 2018-08-10 MED ORDER — HEPARIN, PORCINE (PF) 5,000 UNIT/0.5 ML IJ SYRG
5000 [IU] | Freq: Once | SUBCUTANEOUS | 0 refills | Status: CP
Start: 2018-08-10 — End: ?

## 2018-08-10 MED ORDER — LIDOCAINE HCL 20 MG/ML (2 %) IJ SOLN
0 refills | Status: DC
Start: 2018-08-10 — End: 2018-08-10
  Administered 2018-08-10 (×2): 5 mL via TOPICAL

## 2018-08-10 MED ORDER — INSULIN PUMP - LISPRO
Freq: Three times a day (TID) | SUBCUTANEOUS | 0 refills | Status: DC | PRN
Start: 2018-08-10 — End: 2018-08-14

## 2018-08-10 MED ORDER — PROPOFOL INJ 10 MG/ML IV VIAL
0 refills | Status: DC
Start: 2018-08-10 — End: 2018-08-10
  Administered 2018-08-10: 06:00:00 150 mg via INTRAVENOUS

## 2018-08-10 MED ORDER — ACETAMINOPHEN 325 MG PO TAB
650 mg | ORAL | 0 refills | Status: AC
Start: 2018-08-10 — End: ?
  Administered 2018-08-10 – 2018-08-13 (×15): 650 mg via ORAL

## 2018-08-10 MED ORDER — OXYCODONE 5 MG PO TAB
5-10 mg | ORAL | 0 refills | Status: DC | PRN
Start: 2018-08-10 — End: 2018-08-14
  Administered 2018-08-10 – 2018-08-11 (×4): 5 mg via ORAL
  Administered 2018-08-11: 15:00:00 10 mg via ORAL
  Administered 2018-08-11 – 2018-08-12 (×3): 5 mg via ORAL

## 2018-08-10 MED ORDER — SIMETHICONE 80 MG PO CHEW
80 mg | ORAL | 0 refills | Status: DC | PRN
Start: 2018-08-10 — End: 2018-08-14
  Administered 2018-08-11: 04:00:00 80 mg via ORAL

## 2018-08-10 MED ORDER — SUCCINYLCHOLINE CHLORIDE 20 MG/ML IJ SOLN
0 refills | Status: DC
Start: 2018-08-10 — End: 2018-08-10
  Administered 2018-08-10: 06:00:00 100 mg via INTRAVENOUS

## 2018-08-10 MED ORDER — DIPHTH,PERTUS(ACELL),TETANUS 2.5-8-5 LF-MCG-LF/0.5ML IM SUSP
.5 mL | Freq: Once | INTRAMUSCULAR | 0 refills | Status: DC
Start: 2018-08-10 — End: 2018-08-11

## 2018-08-10 MED ORDER — FENTANYL CITRATE (PF) 50 MCG/ML IJ SOLN
0 refills | Status: DC
Start: 2018-08-10 — End: 2018-08-10
  Administered 2018-08-10: 07:00:00 40 ug via INTRAVENOUS
  Administered 2018-08-10: 06:00:00 50 ug via EPIDURAL

## 2018-08-10 MED ORDER — NALOXONE 0.4 MG/ML IJ SOLN
.08 mg | INTRAVENOUS | 0 refills | Status: DC | PRN
Start: 2018-08-10 — End: 2018-08-10

## 2018-08-10 MED ORDER — OXYTOCIN IN 0.9 % SOD CHLORIDE 30 UNIT/500 ML IV SOLN
30 [IU] | Freq: Once | INTRAVENOUS | 0 refills | Status: AC | PRN
Start: 2018-08-10 — End: ?

## 2018-08-10 MED ORDER — DOCUSATE SODIUM 100 MG PO CAP
100 mg | Freq: Two times a day (BID) | ORAL | 0 refills | Status: DC
Start: 2018-08-10 — End: 2018-08-14
  Administered 2018-08-10 – 2018-08-13 (×7): 100 mg via ORAL

## 2018-08-10 MED ORDER — LANOLIN TP CREA: TOPICAL | 0 refills | Status: DC

## 2018-08-10 MED ORDER — MAGNESIUM HYDROXIDE 2,400 MG/10 ML PO SUSP
10 mL | ORAL | 0 refills | Status: DC | PRN
Start: 2018-08-10 — End: 2018-08-14

## 2018-08-10 MED ORDER — FAMOTIDINE 20 MG PO TAB
20 mg | Freq: Two times a day (BID) | ORAL | 0 refills | Status: DC | PRN
Start: 2018-08-10 — End: 2018-08-14
  Administered 2018-08-10 – 2018-08-13 (×2): 20 mg via ORAL

## 2018-08-10 MED ORDER — ONDANSETRON HCL (PF) 4 MG/2 ML IJ SOLN
4 mg | INTRAVENOUS | 0 refills | Status: DC | PRN
Start: 2018-08-10 — End: 2018-08-14

## 2018-08-10 MED ORDER — ENOXAPARIN 40 MG/0.4 ML SC SYRG
40 mg | Freq: Every day | SUBCUTANEOUS | 0 refills | Status: DC
Start: 2018-08-10 — End: 2018-08-14
  Administered 2018-08-13: 01:00:00 40 mg via SUBCUTANEOUS

## 2018-08-10 MED ORDER — IBUPROFEN 600 MG PO TAB
600 mg | ORAL | 0 refills | Status: DC
Start: 2018-08-10 — End: 2018-08-11
  Administered 2018-08-10 – 2018-08-11 (×5): 600 mg via ORAL

## 2018-08-10 MED ORDER — MEASLES,MUMPS & RUBELLA VACC PF (MMR-II) 0.5 ML
.5 mL | Freq: Once | SUBCUTANEOUS | 0 refills | Status: DC
Start: 2018-08-10 — End: 2018-08-11

## 2018-08-10 MED ORDER — LACTATED RINGERS IV SOLP
INTRAVENOUS | 0 refills | Status: DC
  Administered 2018-08-10: 08:00:00 1000.000 mL via INTRAVENOUS

## 2018-08-10 MED ORDER — MORPHINE PCA 50 MG/50 ML SYR (STD CONC)(ADULT)
INTRAVENOUS | 0 refills | Status: DC
  Administered 2018-08-10: 08:00:00 50.000 mL via INTRAVENOUS

## 2018-08-10 MED ORDER — PRENATAL VIT-IRON FUM-FOLIC AC 28 MG IRON- 800 MCG PO TAB
1 | Freq: Every evening | ORAL | 0 refills | Status: DC
Start: 2018-08-10 — End: 2018-08-14
  Administered 2018-08-10 – 2018-08-13 (×4): 1 via ORAL

## 2018-08-10 NOTE — Anesthesia Pre-Procedure Evaluation
Diabetes, well controlled, type 1; using insulin      Pregnant; GA:[redacted]w[redacted]d, GP:G1P0                          Constitution - negative   Physical Exam    Airway Findings      Mallampati: II      TM distance: >3 FB      Neck ROM: full      Mouth opening: good      Airway patency: adequate    Dental Findings: Negative      Cardiovascular Findings: Negative      Rhythm: regular      Rate: normal      No murmur    Pulmonary Findings: Negative      Abdominal Findings: Negative        Gravid    Neurological Findings: Negative      Alert and oriented x 3    Constitutional findings: Negative       Diagnostic Tests  Hematology:   Lab Results   Component Value Date    HGB 11.7 08/09/2018    HCT 33.8 08/09/2018    PLTCT 230 08/09/2018    WBC 8.0 08/09/2018    MCV 87.7 08/09/2018    MCH 30.2 08/09/2018    MCHC 34.5 08/09/2018    MPV 9.6 08/09/2018    RDW 14.3 08/09/2018         General Chemistry:   Lab Results   Component Value Date    NA 139 07/08/2018    K 4.0 07/08/2018    CL 109 07/08/2018    CO2 19 07/08/2018    GAP 11 07/08/2018    BUN 6 07/08/2018    CR 0.61 07/08/2018    GLU 78 07/08/2018    CA 9.5 07/08/2018    ALBUMIN 3.7 07/08/2018    TOTBILI 0.4 07/08/2018      Coagulation: No results found for: PT, PTT, INR      Anesthesia Plan    ASA score: 2   Plan: CSE  NPO status: full stomach      Informed Consent  Anesthetic plan and risks discussed with patient and spouse.  Use of blood products discussed with patient and spouse  Blood Consent: consented      Plan discussed with: surgeon/proceduralist, anesthesiologist and resident.  Comments: (Patient fully aware and understanding of risks, benefits, and alternatives to anesthetic plan. All questions answered.)

## 2018-08-10 NOTE — Anesthesia Pre-Procedure Evaluation
Constitution - negative   Physical Exam    Airway Findings      Mallampati: II      TM distance: >3 FB      Neck ROM: full      Mouth opening: good      Airway patency: adequate    Dental Findings: Negative      Cardiovascular Findings: Negative      Rhythm: regular      Rate: normal      No murmur    Pulmonary Findings: Negative      Abdominal Findings: Negative        Gravid    Neurological Findings: Negative      Alert and oriented x 3    Constitutional findings: Negative       Diagnostic Tests  Hematology:   Lab Results   Component Value Date    HGB 11.7 08/09/2018    HCT 33.8 08/09/2018    PLTCT 230 08/09/2018    WBC 8.0 08/09/2018    MCV 87.7 08/09/2018    MCH 30.2 08/09/2018    MCHC 34.5 08/09/2018    MPV 9.6 08/09/2018    RDW 14.3 08/09/2018         General Chemistry:   Lab Results   Component Value Date    NA 139 07/08/2018    K 4.0 07/08/2018    CL 109 07/08/2018    CO2 19 07/08/2018    GAP 11 07/08/2018    BUN 6 07/08/2018    CR 0.61 07/08/2018    GLU 78 07/08/2018    CA 9.5 07/08/2018    ALBUMIN 3.7 07/08/2018    TOTBILI 0.4 07/08/2018      Coagulation: No results found for: PT, PTT, INR      Anesthesia Plan    ASA score: 2   Plan: CSE  NPO status: full stomach      Informed Consent  Anesthetic plan and risks discussed with patient and spouse.  Use of blood products discussed with patient and spouse  Blood Consent: consented      Plan discussed with: surgeon/proceduralist, anesthesiologist and resident.  Comments: (Patient fully aware and understanding of risks, benefits, and alternatives to anesthetic plan. All questions answered.)
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
## 2018-08-10 NOTE — Operative Report(Direct Entry)
OPERATIVE REPORT    Name: Lauren Foster is a 23 y.o. female     DOB: [birth year]             MRN#: [id number]    DATE OF OPERATION: 08/09/2018    Surgeon(s) and Role:     Tawny Hopping, MD - Primary     Preoperative Diagnosis:    Macrosomia  DMI-poor control  Latent labor    Procedure(s) (LRB):  CESAREAN DELIVERY    Anesthesia Type: Defer to Anesthesia    Description and Findings of Operative Procedure:     Patient is a 23 y.o. G1P0 who presented for IOL 2/2 poorly controlled DMI. EFW was 4500g, so C/S was recommended.     Viable female infant, weighing 4425, with significant central adiposity, apgars pending, intact placenta with 3vc, normal appearing uterus tubes and ovaries.    The patient was taken to the operating room where  anesthesia was found to be adequate.??? She was then prepared and draped in normal sterile fashion in the dorsal supine position with a leftward tilt.??? A Pfannenstiel incision was then made with the scalpel and carried through to the underlying layer of fascia with a knife.???The fascia was incised at the midline and the incision was extended via manual traction.??? Rectus muscles dissected off bluntly. The rectus muscles were then separated in the midline, and the anterior peritoneum was identified and entered bluntly in the superior aspect. The bladder blade was then inserted and bladder identified.??? The bladder blade was then reinserted and the lower uterine segment incised in a transverse fashion with the scalpel.??? The uterine incision was then extended superiorly and inferiorly with manual traction. Head was delivered atraumatically and body followed easily. The mouth and nares were suctioned with a bulb and the cord was doubly clamped and cut.??? Pitocin IM was begun at cord clamp.??? The infant was handed off to the nursing staff.??? Cord gases and cord blood were sent.???     The placenta was then removed manually and cleared of all clots and

## 2018-08-11 ENCOUNTER — Encounter: Admit: 2018-08-11 | Discharge: 2018-08-11

## 2018-08-11 DIAGNOSIS — E119 Type 2 diabetes mellitus without complications: Principal | ICD-10-CM

## 2018-08-11 DIAGNOSIS — K589 Irritable bowel syndrome without diarrhea: ICD-10-CM

## 2018-08-11 DIAGNOSIS — K929 Disease of digestive system, unspecified: ICD-10-CM

## 2018-08-11 DIAGNOSIS — F4310 Post-traumatic stress disorder, unspecified: ICD-10-CM

## 2018-08-11 LAB — POC GLUCOSE
Lab: 70 mg/dL (ref 70–100)
Lab: 107 mg/dL — ABNORMAL HIGH (ref 70–100)
Lab: 74 mg/dL (ref 70–100)
Lab: 84 mg/dL (ref 70–100)
Lab: 99 mg/dL (ref 70–100)

## 2018-08-11 LAB — CBC
Lab: 11 g/dL — ABNORMAL LOW (ref 12.0–15.0)
Lab: 14 % (ref 11–15)
Lab: 212 10*3/uL (ref 150–400)
Lab: 3.7 M/UL — ABNORMAL LOW (ref 4.0–5.0)
Lab: 30 pg (ref 26–34)
Lab: 33 % — ABNORMAL LOW (ref 36–45)
Lab: 34 g/dL (ref 32.0–36.0)
Lab: 8.9 FL (ref 7–11)
Lab: 88 FL (ref 80–100)
Lab: 9.8 10*3/uL (ref 4.5–11.0)

## 2018-08-11 LAB — HEMOGLOBIN A1C: Lab: 5.8 % (ref 4.0–6.0)

## 2018-08-11 MED ORDER — INSULIN PUMP - LISPRO: SUBCUTANEOUS | 0 refills | Status: CN

## 2018-08-11 MED ORDER — KETOROLAC 15 MG/ML IJ SOLN
15 mg | INTRAVENOUS | 0 refills | Status: DC
Start: 2018-08-11 — End: 2018-08-14
  Administered 2018-08-11 – 2018-08-13 (×3): 15 mg via INTRAVENOUS

## 2018-08-11 NOTE — Consults
Endocrinology Note   Name:  Lauren Foster MRN:  [id number] Admission Date:  08/09/2018    Active Problems:    Pregnancy      Reason for Consult:     poorly controlled T1D s/p cesarean, pt. with pump, pt came with post partum settings from her endocrinologist    Assessment / Plan     Lauren Foster is a pleasant 23 y/o F with PMH of type 1 diabetes, IBS and PTSD who presented to [facility] on 3/16 for induction of labor secondary to poorly controlled diabetes. She is s/p c section on 08/10/2018. Endocrinology was consulted for diabetes management, patient on insulin pump.     Type 1 diabetes, with hyopglycemia  Dx at the age of 2  A1c 5.8 07/2018  PTA regimen: insulin pump  (sine 2010)  Hypoglycemic episodes on this regimen: ???yes, overnight lows while pregnant, no lows prior to pregnancy, only highs   CGM: guardian   Follows up with for diabetes management: Dr Janyth Contes in [place]  ????????????????????????????????????????????????????????????????????? Last lipid profile - not available   ??? On ACEi/ARB?:???not at this time given recent pregnancy   ??? On Statin?: not at this time given recent pregnancy     Pump Manufacturer: Medtronic    Model  630G  Insulin type  *  Insulin Time (IOB): 2 hours-->3 hours    Insulin Pump Settings - (Changes this admission made in bold)  Time Basal Rate ISF I:C Ratio Time Basal Rate ISF I:C Ratio   MN  1.15-->1.05  28-->40   1:7 noon         0100      1300        0200      1400        0300      1500   1.55     0400      1600        0500      1700        0600      1800        0700      1900        0800      2000        0930  1.75     2100        1000      2200        1100      2300        1200       MN           BG targets:   MN-9AM 90-130  9AM-11PM 80-120  11PM-12AM 90-130    Total daily basal during pregnancy 33.7 units  Total daily basal with new settings post partum 34.5 units     s/p c section 08/10/2018    Impression / Recommendations    Patient with uncontrolled type 1 diabetes. She notes the low yesterday was

## 2018-08-12 LAB — POC GLUCOSE
Lab: 98 mg/dL (ref 70–100)
Lab: 73 mg/dL (ref 70–100)
Lab: 51 mg/dL — ABNORMAL LOW (ref 70–100)
Lab: 83 mg/dL — ABNORMAL LOW (ref >=60)
Lab: 82 mg/dL (ref 70–100)
Lab: 61 mg/dL — ABNORMAL LOW (ref 70–100)
Lab: 63 mg/dL — ABNORMAL LOW (ref 70–100)
Lab: 139 mg/dL — ABNORMAL HIGH (ref 70–100)
Lab: 112 mg/dL — ABNORMAL HIGH (ref 70–100)
Lab: 114 mg/dL — ABNORMAL HIGH (ref 70–100)

## 2018-08-12 MED ORDER — GABAPENTIN 100 MG PO CAP
100 mg | Freq: Three times a day (TID) | ORAL | 0 refills | Status: DC
Start: 2018-08-12 — End: 2018-08-14
  Administered 2018-08-12 – 2018-08-13 (×4): 100 mg via ORAL

## 2018-08-12 NOTE — Care Plan
Goal: Absence of surgical site infection  Outcome: Goal Ongoing     Problem: Labor  Goal: Labor duration, first stage, within specified parameters  Outcome: Goal Achieved  Goal: Labor duration, second stage, within specified parameters  Outcome: Goal Achieved  Goal: Uterine contraction within specified parameters  Outcome: Goal Achieved     Problem: Infection, Risk of, Urinary Catheter-Associated Urinary Tract Infection  Goal: Absence of urinary catheter-associated infection  Outcome: Goal Achieved

## 2018-08-13 ENCOUNTER — Encounter: Admit: 2018-08-13 | Discharge: 2018-08-13

## 2018-08-13 ENCOUNTER — Inpatient Hospital Stay: Admit: 2018-08-09 | Discharge: 2018-08-09

## 2018-08-13 DIAGNOSIS — Z62810 Personal history of physical and sexual abuse in childhood: ICD-10-CM

## 2018-08-13 DIAGNOSIS — Z794 Long term (current) use of insulin: ICD-10-CM

## 2018-08-13 DIAGNOSIS — O3663X0 Maternal care for excessive fetal growth, third trimester, not applicable or unspecified: ICD-10-CM

## 2018-08-13 DIAGNOSIS — O2402 Pre-existing diabetes mellitus, type 1, in childbirth: Principal | ICD-10-CM

## 2018-08-13 DIAGNOSIS — Z3A37 37 weeks gestation of pregnancy: ICD-10-CM

## 2018-08-13 DIAGNOSIS — Z9641 Presence of insulin pump (external) (internal): ICD-10-CM

## 2018-08-13 DIAGNOSIS — E10649 Type 1 diabetes mellitus with hypoglycemia without coma: ICD-10-CM

## 2018-08-13 LAB — POC GLUCOSE
Lab: 119 mg/dL — ABNORMAL HIGH (ref 70–100)
Lab: 121 mg/dL — ABNORMAL HIGH (ref 70–100)
Lab: 140 mg/dL — ABNORMAL HIGH (ref 70–100)
Lab: 47 mg/dL — CL (ref 70–100)
Lab: 61 mg/dL — ABNORMAL LOW (ref 70–100)
Lab: 83 mg/dL (ref 70–100)
Lab: 93 mg/dL (ref 70–100)

## 2018-08-13 MED ORDER — LANOLIN TP CREA
TOPICAL | 1 refills | Status: AC | PRN
Start: 2018-08-13 — End: ?

## 2018-08-13 MED ORDER — GABAPENTIN 100 MG PO CAP
100 mg | ORAL_CAPSULE | Freq: Three times a day (TID) | ORAL | 0 refills | Status: AC
Start: 2018-08-13 — End: ?
  Filled 2018-08-13 (×2): qty 30, 10d supply, fill #1

## 2018-08-13 MED ORDER — LIDOCAINE HCL 10 MG/ML (1 %) IJ SOLN
10 mL | Freq: Once | INTRAMUSCULAR | 0 refills | Status: DC
Start: 2018-08-13 — End: 2018-08-13

## 2018-08-13 MED ORDER — OXYCODONE 5 MG PO TAB
5-10 mg | ORAL_TABLET | ORAL | 0 refills | 6.00000 days | Status: AC | PRN
Start: 2018-08-13 — End: ?

## 2018-08-13 MED ORDER — DOCUSATE SODIUM 100 MG PO CAP
100 mg | ORAL_CAPSULE | Freq: Two times a day (BID) | ORAL | 1 refills | Status: AC
Start: 2018-08-13 — End: ?
  Filled 2018-08-13 (×2): qty 180, 30d supply, fill #1

## 2018-08-13 MED ORDER — ETONOGESTREL 68 MG SDRM IMPL
68 mg | Freq: Once | SUBCUTANEOUS | 0 refills | Status: DC
Start: 2018-08-13 — End: 2018-08-13

## 2018-08-13 NOTE — Care Plan
Problem: Discharge Planning  Goal: Participation in plan of care  Outcome: Goal Achieved  Goal: Knowledge regarding plan of care  Outcome: Goal Achieved  Goal: Prepared for discharge  Outcome: Goal Achieved     Problem: Anxiety  Goal: Alleviation of anxiety  Outcome: Goal Achieved     Problem: Pain  Goal: Management of pain  Outcome: Goal Achieved  Goal: Knowledge of pain management  Outcome: Goal Achieved     Problem: Breastfeeding  Goal: Effective breast-feeding  Outcome: Goal Achieved  Goal: Able to breast-feed without pain  Outcome: Goal Achieved  Goal: Knowledge of breast-feeding  Outcome: Goal Achieved     Problem: Maternal Injury, Risk of  Goal: Absence of physical injury  Outcome: Goal Achieved  Goal: Absence of hemorrhage signs and symptoms  Outcome: Goal Achieved     Problem: Parent-Infant Attachment, Risk of, Impaired  Goal: Knowledge of parental-infant bonding  Outcome: Goal Achieved     Problem: Skin Integrity  Goal: Skin integrity intact  Outcome: Goal Achieved  Goal: Healing of skin (Wound & Incision)  Outcome: Goal Achieved  Goal: Healing of skin (Pressure Injury)  Outcome: Goal Achieved     Problem: Infection, Risk of, Surgical Site Infection  Goal: Absence of surgical site infection  Outcome: Goal Achieved

## 2018-08-13 NOTE — Discharge Instructions - Pharmacy
vitamins, prenatal w/iron & folate (PREPLUS) 27-1 mg tablet Take 1 tablet by mouth daily.    PRESCRIPTION TYPE:  Historical Med           Scheduled appointments:     Please call your ObGyn to schedule a 6-week follow-up appointment.                 Pending items needing follow up: none    Signed:  Jettie Pagan, MD  08/13/2018      cc:  Primary Care Physician:  Rex Kras   Verified  Referring physicians:  Self, Referral   Additional provider(s):

## 2018-08-13 NOTE — Lactation Note
LACTATION SPECIALIST PROGRESS NOTE    Lactation Assessment: Met with mom to follow up on pumping prior to today's discharge.  Mom reports that she is pumping 8-10 times per day and her supply is increasing, yielding about 4 oz total with each pumping.  Mom denies pain or concerns with pumping using size 24 mm flanges. Mom is planning to stay in NICU with baby until discharge which she believes should be this weekend.  Mom plans to use the Symphony pump in baby's room and has a Lansinoh pump to use as a back up. Encouraged mom to incorporate hands on pumping technique as able.    Full discharge breastfeeding education provided in detail to this first time mom. Reviewed handouts and sent with family for home reference including [place] breastfeeding resource list and engorgement relief.  Answered all questions.  Mom denies any further needs or concerns at this time.  Encouraged mom to call lactation line with any breastfeeding concerns post discharge. Answered all questions. Mom denies further concerns for now.  Lactation will continue to follow throughout baby's NICU stay.      Specialist: Darrold Span, RN  Date: 08/13/2018

## 2018-08-17 ENCOUNTER — Encounter: Admit: 2018-08-17 | Discharge: 2018-08-17

## 2018-08-23 ENCOUNTER — Encounter: Admit: 2018-08-23 | Discharge: 2018-08-23

## 2019-10-25 ENCOUNTER — Encounter: Admit: 2019-10-25 | Discharge: 2019-10-25

## 2020-04-11 IMAGING — US OBBPPWONST
1 series · 14 of 18 positions shown · non-contrast
Comparison: none

[Series 1: us ob fetal bpp wo non-stress · 18 acquisitions, 14 frames shown]
[im 1/18]
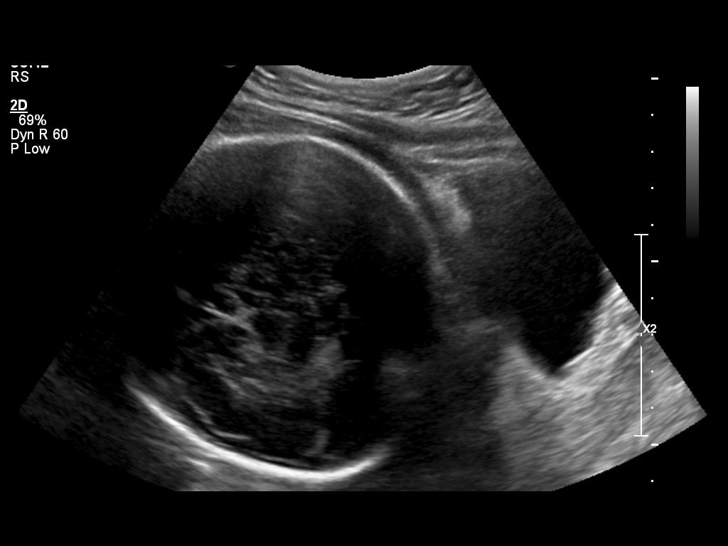
[im 2/18]
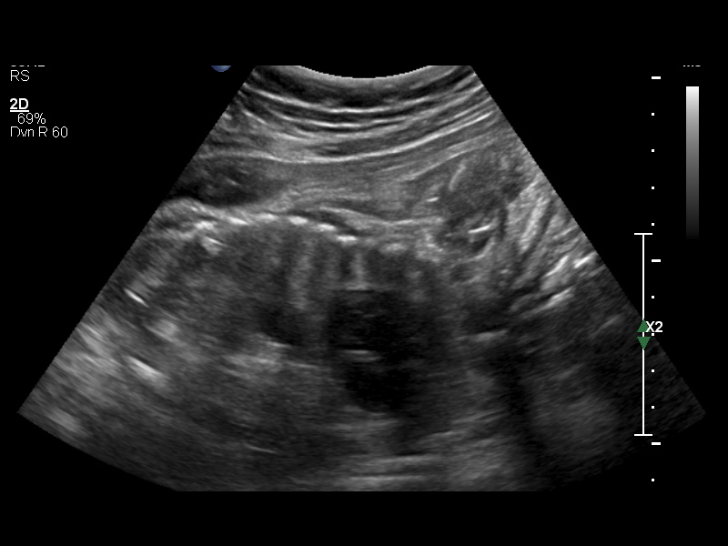
[im 4/18]
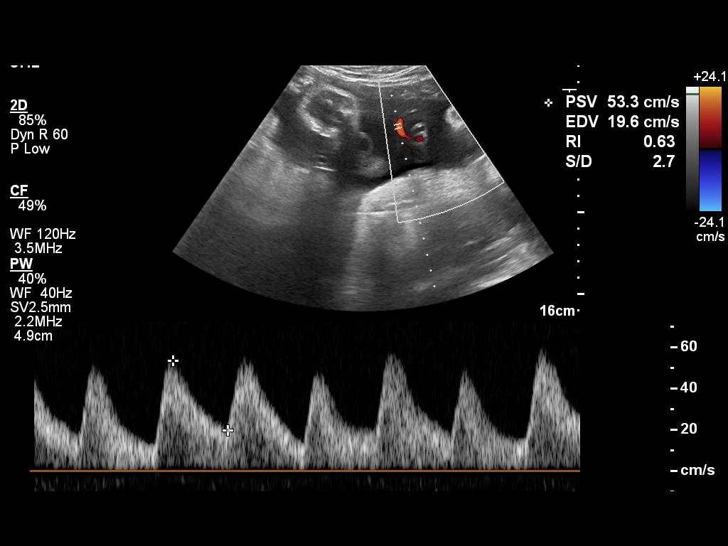
[im 5/18]
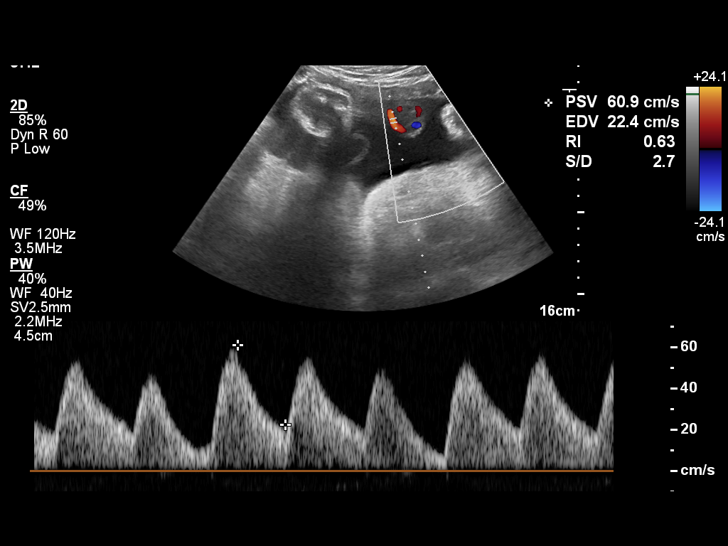
[im 6/18]
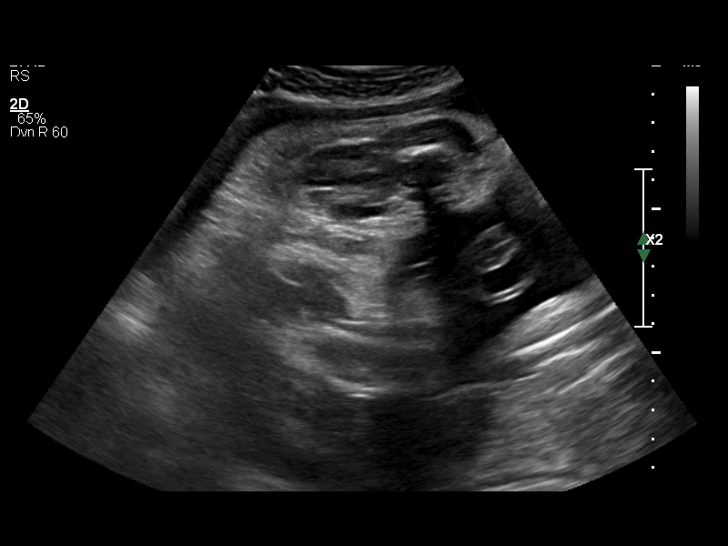
[im 8/18]
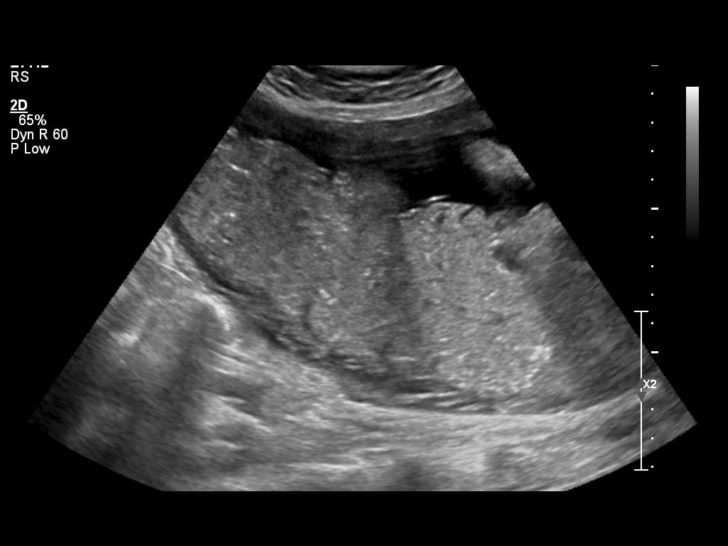
[im 9/18]
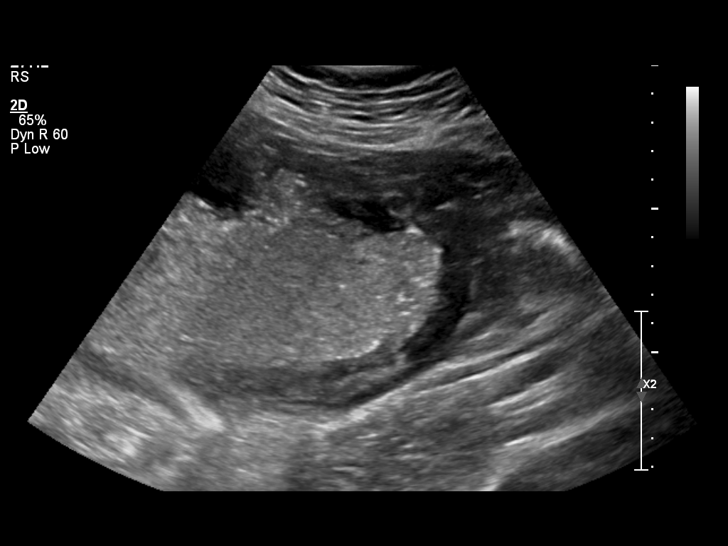
[im 10/18]
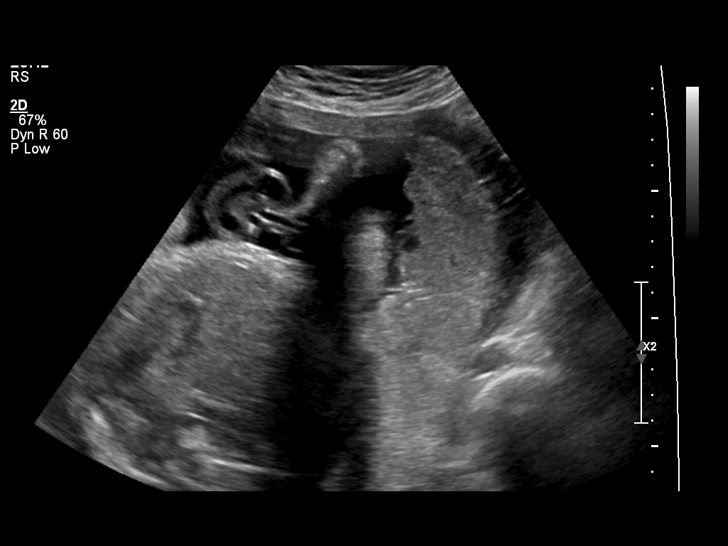
[im 11/18]
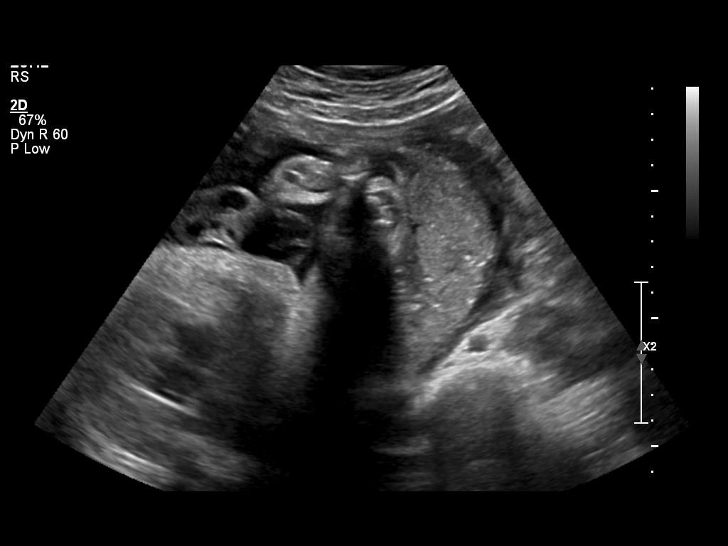
[im 13/18]
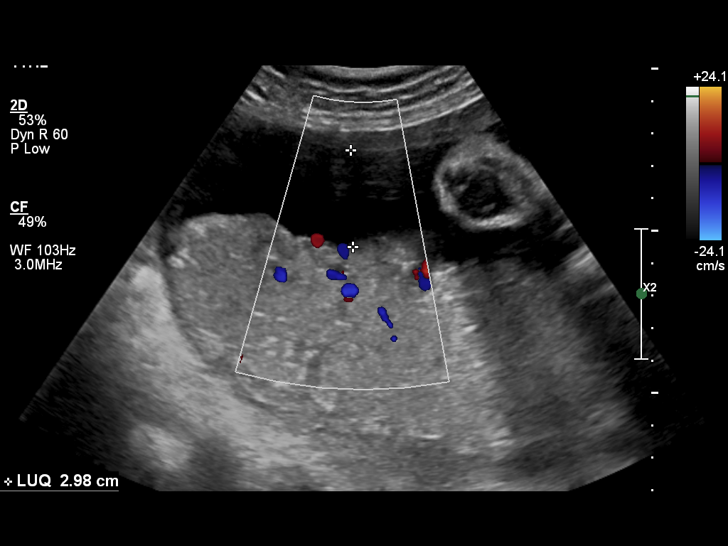
[im 14/18]
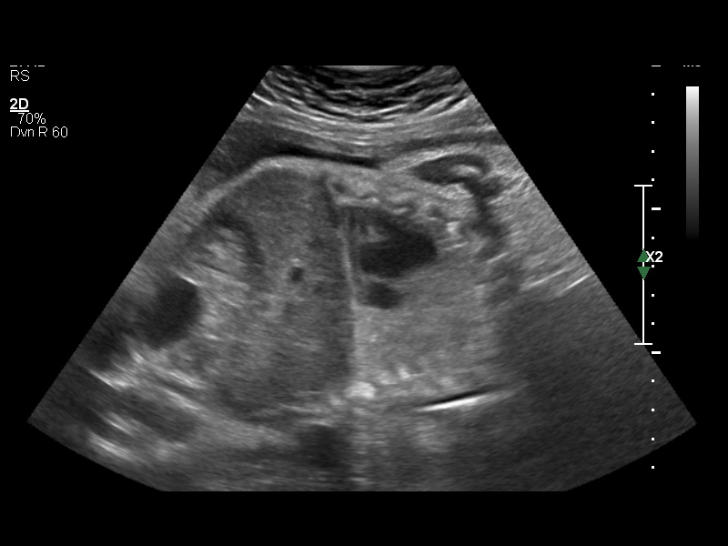
[im 15/18]
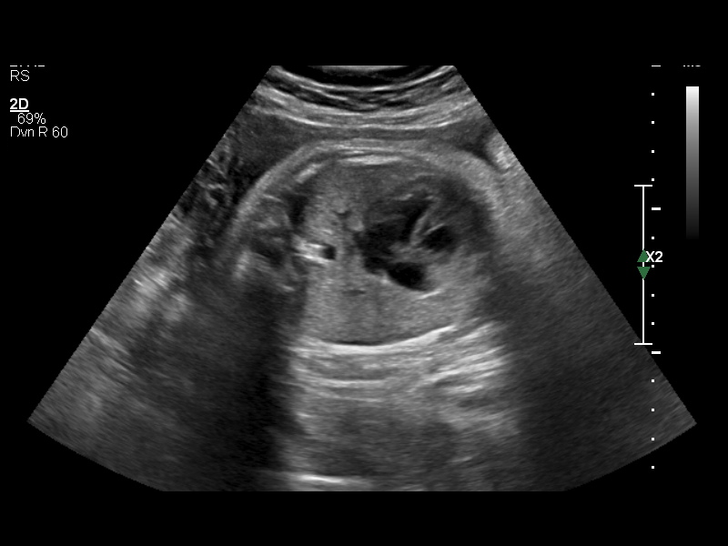
[im 17/18]
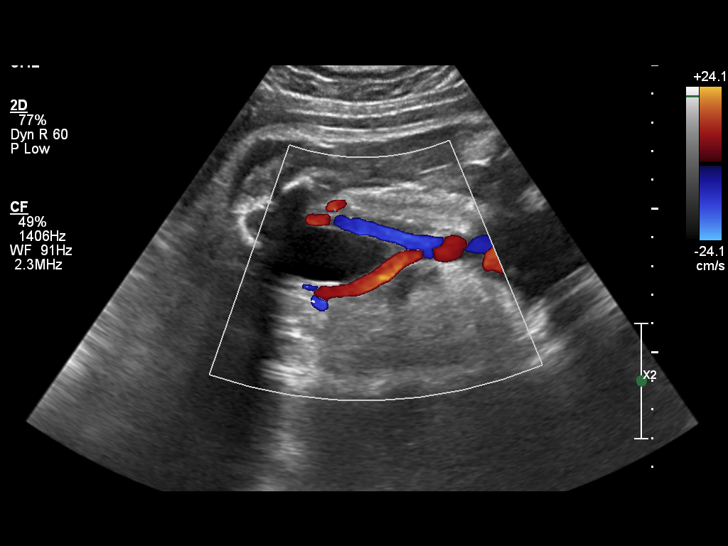
[im 18/18]
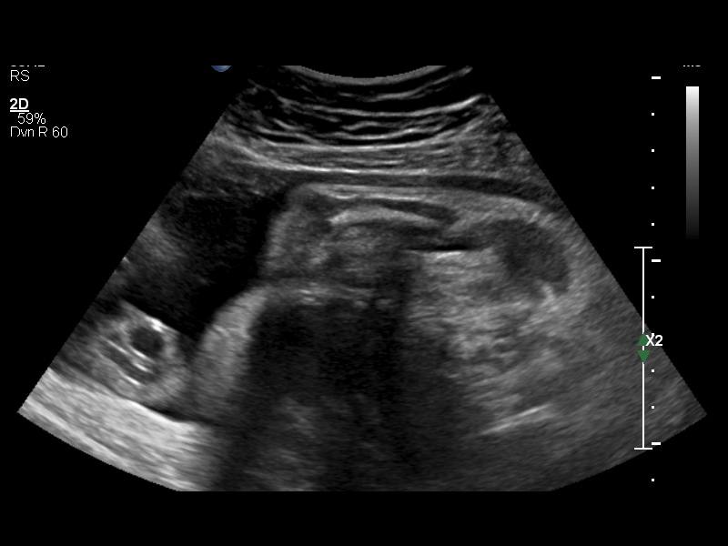

[14 of 18 positions shown; findings below may reference images not displayed]

DIAGNOSTIC STUDIES

EXAM
Obstetric ultrasound with biophysical profile.

INDICATION
Type I DM affecting pregnancy in 3rd trimester
TYPE 1 DM, G1, YWNT5

TECHNIQUE
Standard obstetric ultrasound was performed with biophysical profile.

COMPARISONS
June 28, 2018

FINDINGS
Single intra uterine gestation is noted in vertex presentation. Fetal heart rate is 144 beats per
minute. Placenta is posterior and away from the cervical os. Three-vessel cord is noted. Age/D ratio
is 2.7. Amniotic fluid index is 13.8 centimeters. Biophysical profile is [DATE].

IMPRESSION
Biophysical profile of [DATE]. Fetal heart rate 144 beats per minute. Amniotic fluid index is normal.

Tech Notes:

TYPE 1 DM, G1, YWNT5

## 2023-09-12 ENCOUNTER — Encounter: Admit: 2023-09-12 | Discharge: 2023-09-12

## 2023-09-22 ENCOUNTER — Encounter: Admit: 2023-09-22 | Discharge: 2023-09-22

## 2023-10-14 ENCOUNTER — Ambulatory Visit: Admit: 2023-10-14 | Discharge: 2023-10-15 | Payer: PRIVATE HEALTH INSURANCE

## 2023-10-14 ENCOUNTER — Encounter: Admit: 2023-10-14 | Discharge: 2023-10-14 | Payer: PRIVATE HEALTH INSURANCE

## 2023-10-14 ENCOUNTER — Ambulatory Visit: Admit: 2023-10-14 | Discharge: 2023-10-14 | Payer: PRIVATE HEALTH INSURANCE

## 2023-10-14 DIAGNOSIS — R1115 Cyclical vomiting syndrome unrelated to migraine: Secondary | ICD-10-CM

## 2023-10-14 DIAGNOSIS — K819 Cholecystitis, unspecified: Secondary | ICD-10-CM

## 2023-10-14 DIAGNOSIS — R1010 Upper abdominal pain, unspecified: Secondary | ICD-10-CM

## 2023-10-14 MED ORDER — CHLORDIAZEPOXIDE-CLIDINIUM 5-2.5 MG PO CAP
1 | ORAL_CAPSULE | Freq: Three times a day (TID) | ORAL | 2 refills | Status: AC
Start: 2023-10-14 — End: ?

## 2023-10-14 MED ORDER — SODIUM,POTASSIUM,MAG SULFATES 17.5-3.13-1.6 GRAM PO SOLR
0 refills | 30.00000 days | Status: AC
Start: 2023-10-14 — End: ?

## 2023-10-14 NOTE — Progress Notes
 Telehealth Visit Note    Date of Service: 10/14/2023    Lauren Foster is a 28 y.o. female.  DOB: [birth year]  MRN: [id number]     Subjective:         The patient, with type 1 diabetes, presents with recurrent abdominal pain and vomiting.    She has been experiencing severe abdominal pain for approximately one year. The pain is cramping, beginning as a dull sensation that intensifies, often worsening at night. It is located in the middle of the abdomen, radiating down to the mid-abdomen and to the right. Episodes last between one to three days and occur about twice a month, with one episode in the last month.    The abdominal pain is accompanied by vomiting and diarrhea. Vomiting occurs any time she eats or drinks during an episode, with up to six episodes of vomiting per day, leading to dehydration. No blood in stools is reported, and bowel movements are generally normal, with occasional diarrhea.    She has undergone extensive diagnostic workup, including an abdominal ultrasound, CT scan, HIDA scan, upper endoscopy, gastric emptying scan, and abdominal X-ray. The CT scan showed thickening of the gallbladder wall and some fluid around it, and the HIDA scan showed an ejection fraction of 31%. Other tests were unremarkable.  She is a type 1 diabetic and reports that during episodes of abdominal pain, her blood sugar levels become uncontrolled, often spiking above 400 mg/dL. She is compliant with her diabetes medications, including amitriptyline for neuropathy, which she has been taking for five years. She is not currently taking gabapentin.    She is going through a divorce and has one child. She occasionally uses marijuana, about once a month, and works in [employer]/product [employer]. There is a family history of cancer, including breast cancer in an aunt and grandmother, and esophageal cancer in a grandfather.    She has experienced significant weight fluctuations, losing up to 20 pounds in a month and a half, but regaining it. She has not identified any specific triggers for her symptoms and reports that the pain often starts when she is lying down, particularly at bedtime.           Objective:           Active Ambulatory Problems     Diagnosis Date Noted    DM (diabetes mellitus) (CMS-HCC) 12/29/2017    [redacted] weeks gestation of pregnancy 04/06/2018    Diabetes mellitus affecting pregnancy 04/06/2018    Pregnancy 08/09/2018     Resolved Ambulatory Problems     Diagnosis Date Noted    Less than [redacted] weeks gestation of pregnancy 12/29/2017    [redacted] weeks gestation of pregnancy 03/28/2018     Past Medical History:   Diagnosis Date    Gastrointestinal disorder     Irritable bowel syndrome     PTSD (post-traumatic stress disorder)        Social History     Socioeconomic History    Marital status: Single   Tobacco Use    Smoking status: Never    Smokeless tobacco: Never   Vaping Use    Vaping status: Former    Substances: Flavoring   Substance and Sexual Activity    Alcohol use: Not Currently    Drug use: Not Currently       Family History   Problem Relation Name Age of Onset    Cancer Paternal Grandmother      Diabetes Paternal Grandmother  Cancer Paternal Grandfather         Allergies   Allergen Reactions    Influenza Virus Vaccines ANAPHYLAXIS    Amoxicillin HIVES    Bactrim [Sulfamethoxazole-Trimethoprim] HIVES    Influenza Vaccine Tr-S 09 (Pf) HIVES     I break out in hives       Patient Active Problem List    Diagnosis Date Noted    Pregnancy 08/09/2018    [redacted] weeks gestation of pregnancy 04/06/2018    Diabetes mellitus affecting pregnancy 04/06/2018    DM (diabetes mellitus) (CMS-HCC) 12/29/2017         Physical Exam  Deferred as this was a telehealth visit    Assessment and Plan:      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  The episodic nature and vomiting pattern suggest cyclic vomiting syndrome. She is advised to cease marijuana use.   Order colonoscopy and upper endoscopy to r/o any other etiology.   Continue amitriptyline and start Librax for pain management.   Schedule an in-person follow-up for an abdominal examination to r/o myofacial pain and refer to Dr. Luanna Rung for evaluation of cholecystectomy.  If symptoms don't improve then can try second line drugs for cyclic vomiting syndrome    Chronic cholecystitis    A CT scan showed gallbladder wall thickening and fluid, while a HIDA scan indicated suboptimal gallbladder function. Refer to Dr. Luanna Rung for evaluation for cholecystectomy.    Type 1 diabetes mellitus    Blood sugars are uncontrolled during episodes of abdominal pain and vomiting, spiking above 400 mg/dL. These episodes likely contribute to poor glycemic control.    Neuropathy    Symptoms are managed with amitriptyline, with no significant changes noted. Continue amitriptyline.     Follow up in 4-6 weeks                        46 minutes spent on this patient's encounter with counseling and coordination of care taking >50% of the visit.

## 2023-10-15 ENCOUNTER — Encounter: Admit: 2023-10-15 | Discharge: 2023-10-15 | Payer: PRIVATE HEALTH INSURANCE

## 2023-10-20 ENCOUNTER — Encounter: Admit: 2023-10-20 | Discharge: 2023-10-20 | Payer: PRIVATE HEALTH INSURANCE

## 2023-12-14 ENCOUNTER — Encounter: Admit: 2023-12-14 | Discharge: 2023-12-14 | Payer: PRIVATE HEALTH INSURANCE

## 2023-12-17 ENCOUNTER — Encounter: Admit: 2023-12-17 | Discharge: 2023-12-17 | Payer: PRIVATE HEALTH INSURANCE

## 2023-12-17 ENCOUNTER — Ambulatory Visit: Admit: 2023-12-17 | Discharge: 2023-12-18 | Payer: PRIVATE HEALTH INSURANCE

## 2023-12-17 DIAGNOSIS — R1011 Right upper quadrant pain: Principal | ICD-10-CM

## 2023-12-17 DIAGNOSIS — E109 Type 1 diabetes mellitus without complications: Secondary | ICD-10-CM

## 2023-12-17 NOTE — Progress Notes
 Date of Service: 12/17/23      Subjective:     Lauren Foster is a 28 y.o. female    History of Present Illness  28 year old female with past medical history of IBS, DM.  Most recent hemoglobin A1c on 5/24 was 7.2.  Current BMI is 34.3.    Patient reports that for the past year she has had abdominal pain that is generally in the epigastric region and occasionally radiates around to the right side.  Pain is random and causes intense pain for 1 to 2 days.  Patient has associated nausea, vomiting, mucus-like diarrhea.  She denies any postprandial pain.  She has changed her diet in several different ways without any relief of symptoms.  Patient has been evaluated by GI, Dr. Rogers.  She has underwent EGD, gastric emptying study without etiology of the symptoms.SABRA  HIDA scan  01/12/2023 demonstrated EF of 31%.  CT demonstrated gallbladder thickening.  US  11/18/22 without abnormality.       ROS     10 point ROS as per HPI and otherwise neg  Past Medical History:    DM (diabetes mellitus) (CMS-HCC)    Gastrointestinal disorder    Irritable bowel syndrome    PTSD (post-traumatic stress disorder)      reports that she has never smoked. She has never used smokeless tobacco. She reports that she does not currently use alcohol. She reports that she does not currently use drugs.  Surgical History:   Procedure Laterality Date    CESAREAN DELIVERY N/A 08/09/2018    Performed by Elnor Hoy POUR, MD at [facility] OR    ABDOMEN SURGERY      Laproscopic     WISDOM TEETH EXTRACTION        amitriptyline (ELAVIL) 100 mg tablet Take one tablet by mouth at bedtime daily.    amphetamine-dextroamphetamine XR (ADDERALL XR) 10 mg capsule Take one capsule by mouth every morning.    chlordiazePOXIDE -clidinium (LIBRAX  (WITH CLINIDIUM)) 2.5/5 mg capsule Take one capsule by mouth three times daily.    DEXCOM G7 SENSOR sensor device Apply 1 sensor and change every 10 days    insulin  aspart U-100 (NOVOLOG U-100 INSULIN  ASPART) 100 unit/mL injection Insulin  via omnipod pump. Per ICR of 1:12 and Total Basal of 31.45.    insulin  pump -LISPRO- Patients Own 100 Units 100 Units by SubQ Pump route.    lamoTRIgine (LAMICTAL) 25 mg tablet TAKE ONE TABLET BY MOUTH EVERY DAY FOR 7 DAYS, THEN TAKE TWO TABLETS BY MOUTH EVERY DAY THEREAFTER    metoclopramide HCL (REGLAN) 10 mg tablet Take one tablet by mouth every 6 hours as needed.    OMNIPOD 5 G6-G7 INTRO KT(GEN5) with controller subcutaneous USE AS DIRECTED WILL LOOK UP DIRECTIONS FOR KIT, DO NOT DISPENSE TILL DIRECTIONS ARE CORRECTED.SABRA ALSO SEE PRICE!!!    OMNIPOD 5 G6-G7 PODS (GEN 5) cartridge USE WITH INSULIN  AND CHANGE EVERY 3 DAYS    ondansetron  (ZOFRAN  ODT) 4 mg rapid dissolve tablet Dissolve one tablet by mouth every 4 hours as needed. Place on tongue to disolve.    pantoprazole DR (PROTONIX) 40 mg tablet Take one tablet by mouth daily.    promethazine (PHENERGAN) 12.5 mg tablet Take one tablet by mouth every 6 hours as needed. (Patient not taking: Reported on 12/17/2023)    sodium,potassium,mag sulfates (SUPREP BOWEL PREP KIT) 17.5-3.13-1.6 gram oral solution Evening prior: mix 1 bottle with 16 ounces of water and drink orally, followed by 32 additional ounces of  water.  Day of: mix 2nd bottle and drink orally, followed by 32 ounces of water. Complete the bowel preparation at least 2 hrs before procedure.         Objective:  Vitals:    12/17/23 1332   BP: 127/73   Pulse: 88   Temp: 36.6 ?C (97.9 ?F)   Resp: 18   SpO2: 100%     Body mass index is 26.74 kg/m?SABRA    Physical Exam  Constitutional:       Appearance: Normal appearance.   HENT:      Head: Normocephalic and atraumatic.   Eyes:      Conjunctiva/sclera: Conjunctivae normal.   Pulmonary:      Effort: Pulmonary effort is normal.   Abdominal:      Palpations: Abdomen is soft.   Genitourinary:     Comments: none  Skin:     General: Skin is warm and dry.   Neurological:      Mental Status: He is alert.   Psychiatric:         Mood and Affect: Mood normal. Behavior: Behavior normal.     IMAGING/DIAGNOSTICS:  US  11/18/2022:  Impression:    No sonographic abnormality of the abdomen as detailed above.      HIDA 01/12/2023 demonstrated minimally diminished gallbladder ejection fraction at 31%.     Assessment & Plan:  Lauren Foster was seen today for consult.    Diagnoses and all orders for this visit:    Right upper quadrant abdominal pain  -     NM HEPATOBILIARY SCAN; Future; Expected date: 12/17/2023  -     US  ABDOMEN LIMITED; Future; Expected date: 12/17/2023    Type 1 diabetes mellitus without complication (CMS-HCC)      28 year old female with past medical history of IBS, DM.        1.  We discussed risk/benefit of surgical repair in addition to alternatives to surgical intervention.  We discussed that symptoms are not necessarily consistent with biliary colic as they do radiate to the right side, but are not postprandial.  Pain seems to be random.    2.  US  to assess for cholelithiasis, gallbladder thickening, sludge, other abnormality.  Last US  was completed in 2024 approximately 1 year ago.    3.  HIDA to assess EF.    4.  Continue with colonoscopy on 9/19.    5. We discussed common signs and symptoms of biliary disease:  Sudden pain in the right upper part of the abdomen, which is commonly severe few hours after food  Fever  Severe pain in the upper middle part of the abdomen  Right shoulder tip  Pain in between the shoulder blades    In regards to diet, education provided:  Eat more foods that are high in fiber, such as  fruits, vegetables, beans, and peas.  whole grains, including brown rice, oats, and whole wheat bread.  Eat fewer refined carbohydrates and less sugar.  Eat healthy fats, like fish oil and olive oil, to help your gallbladder contract and empty on a regular basis.  Avoid unhealthy fats, like those often found in desserts and fried foods.    6.  Discussed all of patients questions and concerns.  Encouraged patient to call the office with any further questions or concerns.    7.  Follow-up via telehealth after HIDA scan, US , colonoscopy.  Will determine plan of care at that point in time.  If any symptoms are more  consistent with biliary dyskinesia or any radiographic findings are positive we will discuss possible surgical plan of care.    60 minutes was spent for patient preparation, evaluation, diagnosis, and treatment.  Social history, PMH, surgical history, medications were reviewed.  BMI was evaluated.  Comorbidites and patient health were reviewed to identify if risk stratification needed to be obtained.  Goal for patient is to improve modifiable risk factors for medical optimization prior to elective surgical repair.  Goals and rational were reviewed with patient.

## 2023-12-17 NOTE — Progress Notes
 US  and HIDA (NM hepatobiliary scan) ordered by NP - pt requested completion at [facility].      Orders faxed to [phone] successfully on 12/17/23 at 15:31.  Orders include request for images to be clouded.

## 2023-12-18 ENCOUNTER — Encounter: Admit: 2023-12-18 | Discharge: 2023-12-18 | Payer: PRIVATE HEALTH INSURANCE

## 2023-12-21 ENCOUNTER — Encounter: Admit: 2023-12-21 | Discharge: 2023-12-21 | Payer: PRIVATE HEALTH INSURANCE

## 2023-12-21 NOTE — Telephone Encounter
-    Spoke to Gena who states that the pt had a US  on 10/20/2023 in the ER, would like to know if she needs another completed & if it needs to be done before the HIDA scan.    -  Instructed Gena that another US  needs to be completed & it needs to be completed before the HIDA scan.      -  She would like a copy of Last Office Note faxed to them at [phone] so that they can do the PA for the HIDA scan.

## 2023-12-23 ENCOUNTER — Encounter: Admit: 2023-12-23 | Discharge: 2023-12-23 | Payer: PRIVATE HEALTH INSURANCE

## 2023-12-23 ENCOUNTER — Ambulatory Visit: Admit: 2023-12-23 | Discharge: 2023-12-23 | Payer: PRIVATE HEALTH INSURANCE

## 2023-12-24 ENCOUNTER — Encounter: Admit: 2023-12-24 | Discharge: 2023-12-24 | Payer: PRIVATE HEALTH INSURANCE

## 2023-12-30 ENCOUNTER — Encounter: Admit: 2023-12-30 | Discharge: 2023-12-30 | Payer: PRIVATE HEALTH INSURANCE

## 2024-02-11 ENCOUNTER — Encounter: Admit: 2024-02-11 | Discharge: 2024-02-11 | Payer: PRIVATE HEALTH INSURANCE

## 2024-02-11 NOTE — Telephone Encounter
 Pre procedure review  EGD/colonoscopy/capsule endoscopy - n/v, diarrhea, abd pain  Referring: Penne Hocking  Seen in clinic

## 2024-02-12 ENCOUNTER — Ambulatory Visit: Admit: 2024-02-12 | Discharge: 2024-02-12 | Payer: BLUE CROSS/BLUE SHIELD

## 2024-02-12 ENCOUNTER — Encounter: Admit: 2024-02-12 | Discharge: 2024-02-12 | Payer: PRIVATE HEALTH INSURANCE

## 2024-02-12 ENCOUNTER — Encounter: Admit: 2024-02-12 | Discharge: 2024-02-12 | Payer: BLUE CROSS/BLUE SHIELD

## 2024-02-12 MED ORDER — PROPOFOL INJ 10 MG/ML IV VIAL
INTRAVENOUS | 0 refills | Status: DC
Start: 2024-02-12 — End: 2024-02-12

## 2024-02-12 MED ORDER — PROPOFOL 10 MG/ML IV EMUL 20 ML (INFUSION)(AM)(OR)
INTRAVENOUS | 0 refills | Status: DC
Start: 2024-02-12 — End: 2024-02-12
  Administered 2024-02-12: 13:00:00 150 ug/kg/min via INTRAVENOUS

## 2024-02-12 MED ORDER — LIDOCAINE (PF) 20 MG/ML (2 %) IJ SOLN
INTRAVENOUS | 0 refills | Status: DC
Start: 2024-02-12 — End: 2024-02-12

## 2024-02-12 NOTE — Anesthesia Post-Procedure Evaluation
 Post-Anesthesia Evaluation    Name: Lauren Foster      MRN: [id number]     DOB: March 10, 1996     Age: 28 y.o.     Sex: female   __________________________________________________________________________     Procedure Information       Anesthesia Start Date/Time: 02/12/24 9177    Procedures:       ESOPHAGOGASTRODUODENOSCOPY, WITH BIOPSY      COLONOSCOPY WITH BIOPSY - FLEXIBLE      GASTROINTESTINAL TRACT IMAGING ESOPHAGUS THROUGH ILEUM - INTRALUMINAL    Location: ENDO 1 / ENDO/GI    Surgeons: Rogers Kipper, MD            Post-Anesthesia Vitals  BP: 114/75 (09/19 0930)  Temp: 37.1 ?C (98.7 ?F) (09/19 9094)  Pulse: 76 (09/19 0930)  Respirations: 19 PER MINUTE (09/19 0930)  SpO2: 100 % (09/19 0930)  O2 Device: None (Room air) (09/19 0930)   Vitals Value Taken Time   BP 114/75 02/12/24 09:30   Temp 37.1 ?C (98.7 ?F) 02/12/24 09:05   Pulse 76 02/12/24 09:30   Respirations 19 PER MINUTE 02/12/24 09:30   SpO2 100 % 02/12/24 09:30   O2 Device None (Room air) 02/12/24 09:30   ABP     ART BP           Post Anesthesia Evaluation Note    Evaluation location: Pre/Post  Patient participation: recovered; patient participated in evaluation  Level of consciousness: alert    Pain score: 0  Pain management: adequate    Hydration: normovolemia  Temperature: 36.0?C - 38.4?C  Airway patency: adequate    Perioperative Events       Post-op nausea and vomiting: no PONV    Postoperative Status  Cardiovascular status: hemodynamically stable  Respiratory status: spontaneous ventilation  Follow-up needed: none        Perioperative Events  There were no known complications for this encounter.

## 2024-02-14 ENCOUNTER — Encounter: Admit: 2024-02-14 | Discharge: 2024-02-14 | Payer: BLUE CROSS/BLUE SHIELD

## 2024-02-16 ENCOUNTER — Encounter: Admit: 2024-02-16 | Discharge: 2024-02-16 | Payer: BLUE CROSS/BLUE SHIELD

## 2024-02-16 ENCOUNTER — Ambulatory Visit: Admit: 2024-02-16 | Discharge: 2024-02-17 | Payer: BLUE CROSS/BLUE SHIELD

## 2024-02-16 DIAGNOSIS — R1013 Epigastric pain: Principal | ICD-10-CM

## 2024-02-16 NOTE — Patient Instructions
 Please contact our clinic if you have any questions or concerns. Call us  at [phone] or send an email through the MyChart system if you have non-urgent concerns. We will get back to you as soon as possible. For urgent or after hours needs, please call [phone] and ask for the [facility] on call provider.     NOTE: MyChart messages and phone calls received on weekends, on holidays, and after 3:30 pm on weekdays will NOT be seen until the following business day. If you have an urgent matter during these times, please call [phone] to reach the on-call team.     If you need prescription refills, please contact your pharmacy.     Our fax number is [phone]. Please be sure your name and date of birth are on any forms that are sent to us  for completion. We will have them filled out and signed as soon as possible, but our providers are not in clinic every day, so it can take up to a week.     Lab and test results:  As a part of the CARES act, starting 08/25/2019, some results will be released to you via MyChart immediately and automatically.  You may see results before your provider sees them; however, your provider will review all these results and then they, or one of their team, will notify you of result information and recommendations.   Critical results will be addressed immediately, but otherwise, please allow us  time to get back with you prior to you reaching out to us  for questions.  This will usually take about 72 hours for labs and 5-7 days for procedure test results.

## 2024-02-16 NOTE — Progress Notes
 Date of Service: 02/16/24      Subjective:     Lauren Foster is a 28 y.o. female    History of Present Illness  28 year old female with past medical history of IBS, DM.  Most recent hemoglobin A1c on 5/24 was 7.2.  Current BMI is 34.3.  Patient is taking protonix, reglan, and phenergan.    Patient was last seen in clinic on 7/24 without significant change in symptoms.  Repeat US  and HIDA were not completed.      Patient reports that for the past year she has had abdominal pain that is generally in the epigastric region and occasionally radiates around to the right side.  Pain is random and causes intense pain for 1 to 2 days.  Patient has associated nausea, vomiting, mucus-like diarrhea.  She denies any postprandial pain.  She has changed her diet in several different ways without any relief of symptoms.  Patient has been evaluated by GI, Dr. Rogers.  She has underwent EGD, gastric emptying study without etiology of the symptoms.SABRA  HIDA scan  01/12/2023 demonstrated EF of 31%.  CT demonstrated gallbladder thickening.  US  11/18/22 without abnormality.         ROS     10 point ROS as per HPI and otherwise neg  Past Medical History:    Bipolar disorder (CMS-HCC)    Depression    DM (diabetes mellitus) (CMS-HCC)    Gastrointestinal disorder    Irritable bowel syndrome    Kidney stones    PTSD (post-traumatic stress disorder)      reports that she has never smoked. She has never used smokeless tobacco. She reports that she does not currently use alcohol. She reports that she does not currently use drugs.  Surgical History:   Procedure Laterality Date    CESAREAN DELIVERY N/A 08/09/2018    Performed by Elnor Hoy POUR, MD at [facility] OR    ESOPHAGOGASTRODUODENOSCOPY, WITH BIOPSY N/A 02/12/2024    Performed by Rogers Kipper, MD at [facility] ENDO    COLONOSCOPY WITH BIOPSY - FLEXIBLE N/A 02/12/2024    Performed by Rogers Kipper, MD at [facility] ENDO    GASTROINTESTINAL TRACT IMAGING ESOPHAGUS THROUGH ILEUM - INTRALUMINAL N/A 02/12/2024 Performed by Rogers Kipper, MD at [facility] ENDO    ABDOMEN SURGERY      Laproscopic     COLONOSCOPY      WISDOM TEETH EXTRACTION        amitriptyline (ELAVIL) 100 mg tablet Take one tablet by mouth at bedtime daily.    amphetamine-dextroamphetamine XR (ADDERALL XR) 10 mg capsule Take two capsules by mouth every morning.    DEXCOM G7 SENSOR sensor device Apply 1 sensor and change every 10 days    insulin  aspart U-100 (NOVOLOG U-100 INSULIN  ASPART) 100 unit/mL injection Insulin  via omnipod pump. Per ICR of 1:12 and Total Basal of 31.45.    insulin  pump -LISPRO- Patients Own 100 Units 100 Units by SubQ Pump route.    lamoTRIgine (LAMICTAL) 25 mg tablet TAKE ONE TABLET BY MOUTH EVERY DAY FOR 7 DAYS, THEN TAKE TWO TABLETS BY MOUTH EVERY DAY THEREAFTER    metoclopramide HCL (REGLAN) 10 mg tablet Take one tablet by mouth every 6 hours as needed.    OMNIPOD 5 G6-G7 INTRO KT(GEN5) with controller subcutaneous USE AS DIRECTED WILL LOOK UP DIRECTIONS FOR KIT, DO NOT DISPENSE TILL DIRECTIONS ARE CORRECTED.SABRA ALSO SEE PRICE!!!    OMNIPOD 5 G6-G7 PODS (GEN 5) cartridge USE WITH INSULIN  AND CHANGE  EVERY 3 DAYS    ondansetron  (ZOFRAN  ODT) 4 mg rapid dissolve tablet Dissolve one tablet by mouth every 4 hours as needed. Place on tongue to disolve.    pantoprazole DR (PROTONIX) 40 mg tablet Take one tablet by mouth daily.    pregabalin (LYRICA) 75 mg capsule Take one capsule by mouth twice daily.    promethazine (PHENERGAN) 12.5 mg tablet Take one tablet by mouth every 6 hours as needed.    sodium,potassium,mag sulfates (SUPREP BOWEL PREP KIT) 17.5-3.13-1.6 gram oral solution Evening prior: mix 1 bottle with 16 ounces of water and drink orally, followed by 32 additional ounces of water.  Day of: mix 2nd bottle and drink orally, followed by 32 ounces of water. Complete the bowel preparation at least 2 hrs before procedure. (Patient not taking: Reported on 02/16/2024)         Objective:  There were no vitals filed for this visit.    There is no height or weight on file to calculate BMI.    Physical Exam  Constitutional:       Appearance: Normal appearance.   HENT:      Head: Normocephalic and atraumatic.   Eyes:      Conjunctiva/sclera: Conjunctivae normal.   Pulmonary:      Effort: Pulmonary effort is normal.   Abdominal:      Palpations: Abdomen is soft.   Genitourinary:     Comments: none  Skin:     General: Skin is warm and dry.   Neurological:      Mental Status: He is alert.   Psychiatric:         Mood and Affect: Mood normal.         Behavior: Behavior normal.     IMAGING/DIAGNOSTICS:  US  11/18/2022:  Impression:    No sonographic abnormality of the abdomen as detailed above.      HIDA 01/12/2023 demonstrated minimally diminished gallbladder ejection fraction at 31%.     Assessment & Plan:  Lauren Foster was seen today for consult.    Diagnoses and all orders for this visit:    Abdominal pain, epigastric        28 year old female with past medical history of IBS, DM.        1.  We discussed risk/benefit of surgical repair in addition to alternatives to surgical intervention.  We discussed that symptoms are not necessarily consistent with biliary colic as they do radiate to the right side, but are not postprandial.  Pain seems to be random.    2.  May repeat US  and HIDA as previously ordered.      4.  We discussed colonoscopy and EGD were normal     5. We discussed common signs and symptoms of biliary disease:  Sudden pain in the right upper part of the abdomen, which is commonly severe few hours after food  Fever  Severe pain in the upper middle part of the abdomen  Right shoulder tip  Pain in between the shoulder blades    In regards to diet, education provided:  Eat more foods that are high in fiber, such as  fruits, vegetables, beans, and peas.  whole grains, including brown rice, oats, and whole wheat bread.  Eat fewer refined carbohydrates and less sugar.  Eat healthy fats, like fish oil and olive oil, to help your gallbladder contract and empty on a regular basis.  Avoid unhealthy fats, like those often found in desserts and fried foods.  6.  Discussed all of patients questions and concerns.  Encouraged patient to call the office with any further questions or concerns.    7.  Follow-up via telehealth after HIDA scan, US , completed

## 2024-02-17 ENCOUNTER — Encounter: Admit: 2024-02-17 | Discharge: 2024-02-17 | Payer: BLUE CROSS/BLUE SHIELD

## 2024-02-17 NOTE — Telephone Encounter
 Unable to reach patient. Voicemail box is full. Sent [employer].

## 2024-02-17 NOTE — Telephone Encounter
 Patient left a voicemail that she had an EGD,Colonoscopy and small bowel capsule and she is complaining of abdominal pain. She is wondering if she should be seen or if there is concern that the capsule has passed.

## 2024-02-18 ENCOUNTER — Encounter: Admit: 2024-02-18 | Discharge: 2024-02-18 | Payer: BLUE CROSS/BLUE SHIELD

## 2024-03-26 ENCOUNTER — Encounter: Admit: 2024-03-26 | Discharge: 2024-03-26 | Payer: BLUE CROSS/BLUE SHIELD

## 2024-03-30 ENCOUNTER — Encounter: Admit: 2024-03-30 | Discharge: 2024-03-30 | Payer: BLUE CROSS/BLUE SHIELD

## 2024-03-30 ENCOUNTER — Ambulatory Visit: Admit: 2024-03-30 | Discharge: 2024-03-31 | Payer: BLUE CROSS/BLUE SHIELD

## 2024-03-30 DIAGNOSIS — R1031 Right lower quadrant pain: Principal | ICD-10-CM

## 2024-03-30 DIAGNOSIS — R1115 Cyclical vomiting syndrome unrelated to migraine: Secondary | ICD-10-CM

## 2024-03-30 MED ORDER — DULOXETINE 30 MG PO CPDR
30 mg | ORAL_CAPSULE | Freq: Two times a day (BID) | ORAL | 5 refills | 60.00000 days | Status: AC
Start: 2024-03-30 — End: ?

## 2024-03-30 NOTE — Progress Notes [1]
 Telehealth Visit Note    Date of Service: 03/30/2024    Lauren Foster is a 28 y.o. female.  DOB: 26-Jul-1995  MRN: [id number]     Subjective:         Lauren Foster is a 28 year old female with cyclic vomiting syndrome who presents with persistent diarrhea and abdominal pain.    She experiences persistent diarrhea, particularly after eating, with one to two loose bowel movements per day, despite eating only once a day. Occasionally, she wakes up in the middle of the night to have a bowel movement.    She continues to experience abdominal pain, described as being located in the right groin area, which has not improved and remains the same as before.    She has not experienced nausea or vomiting for the past couple of months; previously, she had episodes about once a week.    Her prior workup included a colonoscopy, endoscopy, capsule endoscopy, and CT scan, all of which were normal. She has been taking amitriptyline and was previously on Librex and Lyrica, though she is unsure if Lyrica was effective.    She is going through a divorce and has one child.           Objective:           Active Ambulatory Problems     Diagnosis Date Noted    DM (diabetes mellitus) (CMS-HCC) 12/29/2017    [redacted] weeks gestation of pregnancy 04/06/2018    Diabetes mellitus affecting pregnancy 04/06/2018    Pregnancy 08/09/2018     Resolved Ambulatory Problems     Diagnosis Date Noted    Less than [redacted] weeks gestation of pregnancy 12/29/2017    [redacted] weeks gestation of pregnancy 03/28/2018     Past Medical History:   Diagnosis Date    Bipolar disorder (CMS-HCC)     Depression     Gastrointestinal disorder     Irritable bowel syndrome     Kidney stones     PTSD (post-traumatic stress disorder)        Social History[1]    Family History   Problem Relation Name Age of Onset    Cancer Paternal Grandmother      Diabetes Paternal Grandmother      Cancer Paternal Grandfather         Allergies[2]    Patient Active Problem List    Diagnosis Date Noted Pregnancy 08/09/2018    [redacted] weeks gestation of pregnancy 04/06/2018    Diabetes mellitus affecting pregnancy 04/06/2018    DM (diabetes mellitus) (CMS-HCC) 12/29/2017         Physical Exam  Deferred as this was a telehealth visit    Assessment and Plan:      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 Lyrica was ineffective, so it has been discontinued.   Start Cymbalta 30 mg BID   Continue amitriptyline for managing visceral hypersensitivity.   Prescribe Metamucil and Imodium for diarrhea management.     F/u in 3 months                        30 minutes spent on this patient's encounter with counseling and coordination of care taking >50% of the visit.       [1]   Social History  Socioeconomic History    Marital status: Single   Tobacco  Use    Smoking status: Never    Smokeless tobacco: Never   Vaping Use    Vaping status: Former    Substances: Flavoring   Substance and Sexual Activity    Alcohol use: Not Currently    Drug use: Not Currently    Sexual activity: Yes     Partners: Male     Birth control/protection: I.U.D.   [2]   Allergies  Allergen Reactions    Influenza Virus Vaccines ANAPHYLAXIS    Amoxicillin HIVES    Bactrim [Sulfamethoxazole-Trimethoprim] HIVES    Influenza Vaccine Tr-S 09 (Pf) HIVES     I break out in hives
